# Patient Record
Sex: Male | Born: 1935 | Race: White | Hispanic: No | Marital: Married | Smoking: Former smoker
Health system: Southern US, Community
[De-identification: ages and names within clinical notes are randomized; demographics above are authoritative.]

## PROBLEM LIST (undated history)

## (undated) DIAGNOSIS — E039 Hypothyroidism, unspecified: Secondary | ICD-10-CM

## (undated) DIAGNOSIS — I2510 Atherosclerotic heart disease of native coronary artery without angina pectoris: Secondary | ICD-10-CM

## (undated) DIAGNOSIS — Z7901 Long term (current) use of anticoagulants: Secondary | ICD-10-CM

## (undated) DIAGNOSIS — I4891 Unspecified atrial fibrillation: Secondary | ICD-10-CM

## (undated) DIAGNOSIS — C8190 Hodgkin lymphoma, unspecified, unspecified site: Secondary | ICD-10-CM

## (undated) DIAGNOSIS — R109 Unspecified abdominal pain: Secondary | ICD-10-CM

## (undated) DIAGNOSIS — N429 Disorder of prostate, unspecified: Secondary | ICD-10-CM

## (undated) DIAGNOSIS — J449 Chronic obstructive pulmonary disease, unspecified: Secondary | ICD-10-CM

## (undated) DIAGNOSIS — I219 Acute myocardial infarction, unspecified: Secondary | ICD-10-CM

## (undated) DIAGNOSIS — K8020 Calculus of gallbladder without cholecystitis without obstruction: Secondary | ICD-10-CM

## (undated) DIAGNOSIS — I499 Cardiac arrhythmia, unspecified: Secondary | ICD-10-CM

## (undated) DIAGNOSIS — R413 Other amnesia: Secondary | ICD-10-CM

## (undated) DIAGNOSIS — H269 Unspecified cataract: Secondary | ICD-10-CM

## (undated) DIAGNOSIS — G250 Essential tremor: Secondary | ICD-10-CM

## (undated) DIAGNOSIS — N189 Chronic kidney disease, unspecified: Secondary | ICD-10-CM

## (undated) DIAGNOSIS — E785 Hyperlipidemia, unspecified: Secondary | ICD-10-CM

## (undated) DIAGNOSIS — K469 Unspecified abdominal hernia without obstruction or gangrene: Secondary | ICD-10-CM

## (undated) HISTORY — DX: Atherosclerotic heart disease of native coronary artery without angina pectoris: I25.10

## (undated) HISTORY — DX: Hodgkin lymphoma, unspecified, unspecified site: C81.90

## (undated) HISTORY — DX: Disorder of prostate, unspecified: N42.9

## (undated) HISTORY — PX: HEMORRHOID SURGERY: SHX153

## (undated) HISTORY — PX: CATARACT EXTRACTION, BILATERAL: SHX1313

## (undated) HISTORY — DX: Other amnesia: R41.3

## (undated) HISTORY — DX: Hyperlipidemia, unspecified: E78.5

## (undated) HISTORY — DX: Acute myocardial infarction, unspecified: I21.9

## (undated) HISTORY — PX: PTCA: SHX146

## (undated) HISTORY — PX: TRANSURETHRAL RESECTION OF PROSTATE: SHX73

## (undated) HISTORY — DX: Calculus of gallbladder without cholecystitis without obstruction: K80.20

## (undated) HISTORY — DX: Long term (current) use of anticoagulants: Z79.01

## (undated) HISTORY — DX: Unspecified abdominal pain: R10.9

## (undated) HISTORY — DX: Unspecified cataract: H26.9

## (undated) HISTORY — DX: Essential tremor: G25.0

## (undated) HISTORY — DX: Chronic obstructive pulmonary disease, unspecified: J44.9

## (undated) HISTORY — DX: Chronic kidney disease, unspecified: N18.9

## (undated) HISTORY — DX: Hypothyroidism, unspecified: E03.9

---

## 2001-06-04 DIAGNOSIS — C8190 Hodgkin lymphoma, unspecified, unspecified site: Secondary | ICD-10-CM

## 2001-06-04 HISTORY — DX: Hodgkin lymphoma, unspecified, unspecified site: C81.90

## 2003-06-05 HISTORY — PX: CHOLECYSTECTOMY: SHX55

## 2003-06-22 ENCOUNTER — Emergency Department (HOSPITAL_COMMUNITY): Admission: EM | Admit: 2003-06-22 | Discharge: 2003-06-23 | Payer: Self-pay | Admitting: Emergency Medicine

## 2009-02-19 ENCOUNTER — Emergency Department (HOSPITAL_BASED_OUTPATIENT_CLINIC_OR_DEPARTMENT_OTHER): Admission: EM | Admit: 2009-02-19 | Discharge: 2009-02-20 | Payer: Self-pay | Admitting: Emergency Medicine

## 2009-02-20 ENCOUNTER — Ambulatory Visit: Payer: Self-pay | Admitting: Diagnostic Radiology

## 2009-02-23 ENCOUNTER — Ambulatory Visit: Payer: Self-pay | Admitting: Diagnostic Radiology

## 2009-02-23 ENCOUNTER — Emergency Department (HOSPITAL_BASED_OUTPATIENT_CLINIC_OR_DEPARTMENT_OTHER): Admission: EM | Admit: 2009-02-23 | Discharge: 2009-02-23 | Payer: Self-pay | Admitting: Emergency Medicine

## 2009-04-06 ENCOUNTER — Ambulatory Visit: Payer: Self-pay | Admitting: Radiology

## 2009-04-06 ENCOUNTER — Emergency Department (HOSPITAL_BASED_OUTPATIENT_CLINIC_OR_DEPARTMENT_OTHER): Admission: EM | Admit: 2009-04-06 | Discharge: 2009-04-06 | Payer: Self-pay | Admitting: Emergency Medicine

## 2009-06-24 ENCOUNTER — Ambulatory Visit: Payer: Self-pay | Admitting: Diagnostic Radiology

## 2009-06-24 ENCOUNTER — Emergency Department (HOSPITAL_BASED_OUTPATIENT_CLINIC_OR_DEPARTMENT_OTHER)
Admission: EM | Admit: 2009-06-24 | Discharge: 2009-06-24 | Payer: Self-pay | Source: Home / Self Care | Admitting: Emergency Medicine

## 2009-08-23 ENCOUNTER — Ambulatory Visit: Payer: Self-pay | Admitting: Diagnostic Radiology

## 2009-08-23 ENCOUNTER — Emergency Department (HOSPITAL_BASED_OUTPATIENT_CLINIC_OR_DEPARTMENT_OTHER)
Admission: EM | Admit: 2009-08-23 | Discharge: 2009-08-23 | Payer: Self-pay | Source: Home / Self Care | Admitting: Emergency Medicine

## 2009-09-01 ENCOUNTER — Emergency Department (HOSPITAL_BASED_OUTPATIENT_CLINIC_OR_DEPARTMENT_OTHER): Admission: EM | Admit: 2009-09-01 | Discharge: 2009-09-01 | Payer: Self-pay | Admitting: Emergency Medicine

## 2009-09-01 ENCOUNTER — Ambulatory Visit: Payer: Self-pay | Admitting: Diagnostic Radiology

## 2010-06-18 ENCOUNTER — Emergency Department (HOSPITAL_BASED_OUTPATIENT_CLINIC_OR_DEPARTMENT_OTHER)
Admission: EM | Admit: 2010-06-18 | Discharge: 2010-06-18 | Payer: Self-pay | Source: Home / Self Care | Admitting: Emergency Medicine

## 2010-06-19 LAB — URINALYSIS, ROUTINE W REFLEX MICROSCOPIC
Bilirubin Urine: NEGATIVE
Ketones, ur: NEGATIVE mg/dL
Nitrite: NEGATIVE
Protein, ur: NEGATIVE mg/dL
Specific Gravity, Urine: 1.024 (ref 1.005–1.030)
Urine Glucose, Fasting: NEGATIVE mg/dL
Urobilinogen, UA: 1 mg/dL (ref 0.0–1.0)
pH: 5.5 (ref 5.0–8.0)

## 2010-06-19 LAB — DIFFERENTIAL
Basophils Absolute: 0 10*3/uL (ref 0.0–0.1)
Basophils Relative: 0 % (ref 0–1)
Eosinophils Absolute: 0.3 10*3/uL (ref 0.0–0.7)
Eosinophils Relative: 1 % (ref 0–5)
Lymphocytes Relative: 3 % — ABNORMAL LOW (ref 12–46)
Lymphs Abs: 0.8 10*3/uL (ref 0.7–4.0)
Monocytes Absolute: 2.2 10*3/uL — ABNORMAL HIGH (ref 0.1–1.0)
Monocytes Relative: 8 % (ref 3–12)
Neutro Abs: 24.3 10*3/uL — ABNORMAL HIGH (ref 1.7–7.7)
Neutrophils Relative %: 88 % — ABNORMAL HIGH (ref 43–77)

## 2010-06-19 LAB — BASIC METABOLIC PANEL
BUN: 28 mg/dL — ABNORMAL HIGH (ref 6–23)
CO2: 23 mEq/L (ref 19–32)
Calcium: 8.6 mg/dL (ref 8.4–10.5)
Chloride: 107 mEq/L (ref 96–112)
Creatinine, Ser: 1.3 mg/dL (ref 0.4–1.5)
GFR calc Af Amer: >60 mL/min (ref >60)
GFR calc non Af Amer: 54 mL/min — ABNORMAL LOW (ref >60)
Glucose, Bld: 131 mg/dL — ABNORMAL HIGH (ref 70–99)
Potassium: 4.6 mEq/L (ref 3.5–5.1)
Sodium: 142 mEq/L (ref 135–145)

## 2010-06-19 LAB — CBC
HCT: 45.7 % (ref 39.0–52.0)
Hemoglobin: 15.9 g/dL (ref 13.0–17.0)
MCH: 32.2 pg (ref 26.0–34.0)
MCHC: 34.8 g/dL (ref 30.0–36.0)
MCV: 92.5 fL (ref 78.0–100.0)
Platelets: 219 10*3/uL (ref 150–400)
RBC: 4.94 MIL/uL (ref 4.22–5.81)
RDW: 15.8 % — ABNORMAL HIGH (ref 11.5–15.5)
WBC: 27.6 10*3/uL — ABNORMAL HIGH (ref 4.0–10.5)

## 2010-06-19 LAB — PROTIME-INR
INR: 1.96 — ABNORMAL HIGH (ref 0.00–1.49)
Prothrombin Time: 22.5 seconds — ABNORMAL HIGH (ref 11.6–15.2)

## 2010-06-19 LAB — URINE MICROSCOPIC-ADD ON

## 2010-06-21 LAB — URINE CULTURE
Colony Count: >=100000
Culture  Setup Time: 201201161558

## 2010-08-28 LAB — CBC
HCT: 45.5 % (ref 39.0–52.0)
HCT: 51.8 % (ref 39.0–52.0)
Hemoglobin: 15.7 g/dL (ref 13.0–17.0)
Hemoglobin: 17.5 g/dL — ABNORMAL HIGH (ref 13.0–17.0)
MCHC: 33.8 g/dL (ref 30.0–36.0)
MCHC: 34.5 g/dL (ref 30.0–36.0)
MCV: 87.1 fL (ref 78.0–100.0)
MCV: 88.1 fL (ref 78.0–100.0)
Platelets: 199 10*3/uL (ref 150–400)
Platelets: 208 10*3/uL (ref 150–400)
RBC: 5.22 MIL/uL (ref 4.22–5.81)
RBC: 5.88 MIL/uL — ABNORMAL HIGH (ref 4.22–5.81)
RDW: 19.5 % — ABNORMAL HIGH (ref 11.5–15.5)
RDW: 20.9 % — ABNORMAL HIGH (ref 11.5–15.5)
WBC: 10.5 10*3/uL (ref 4.0–10.5)
WBC: 12.1 10*3/uL — ABNORMAL HIGH (ref 4.0–10.5)

## 2010-08-28 LAB — URINALYSIS, ROUTINE W REFLEX MICROSCOPIC
Bilirubin Urine: NEGATIVE
Glucose, UA: NEGATIVE mg/dL
Hgb urine dipstick: NEGATIVE
Ketones, ur: NEGATIVE mg/dL
Nitrite: NEGATIVE
Protein, ur: NEGATIVE mg/dL
Specific Gravity, Urine: 1.025 (ref 1.005–1.030)
Urobilinogen, UA: 0.2 mg/dL (ref 0.0–1.0)
pH: 5.5 (ref 5.0–8.0)

## 2010-08-28 LAB — BASIC METABOLIC PANEL WITH GFR
BUN: 29 mg/dL — ABNORMAL HIGH (ref 6–23)
CO2: 28 meq/L (ref 19–32)
Calcium: 8.3 mg/dL — ABNORMAL LOW (ref 8.4–10.5)
Chloride: 105 meq/L (ref 96–112)
Creatinine, Ser: 1.3 mg/dL (ref 0.4–1.5)
GFR calc non Af Amer: 54 mL/min — ABNORMAL LOW
Glucose, Bld: 103 mg/dL — ABNORMAL HIGH (ref 70–99)
Potassium: 4.1 meq/L (ref 3.5–5.1)
Sodium: 144 meq/L (ref 135–145)

## 2010-08-28 LAB — COMPREHENSIVE METABOLIC PANEL
ALT: 32 U/L (ref 0–53)
AST: 48 U/L — ABNORMAL HIGH (ref 0–37)
Albumin: 4.5 g/dL (ref 3.5–5.2)
CO2: 30 mEq/L (ref 19–32)
Chloride: 100 mEq/L (ref 96–112)
GFR calc Af Amer: 51 mL/min — ABNORMAL LOW (ref >60)
GFR calc non Af Amer: 42 mL/min — ABNORMAL LOW (ref >60)
Sodium: 143 mEq/L (ref 135–145)
Total Bilirubin: 1.2 mg/dL (ref 0.3–1.2)

## 2010-08-28 LAB — POCT CARDIAC MARKERS
CKMB, poc: 1.2 ng/mL (ref 1.0–8.0)
CKMB, poc: 1.7 ng/mL (ref 1.0–8.0)
CKMB, poc: <1 ng/mL — ABNORMAL LOW (ref 1.0–8.0)
Myoglobin, poc: 39.7 ng/mL (ref 12–200)
Myoglobin, poc: 50.3 ng/mL (ref 12–200)
Troponin i, poc: <0.05 ng/mL (ref 0.00–0.09)
Troponin i, poc: <0.05 ng/mL (ref 0.00–0.09)
Troponin i, poc: <0.05 ng/mL (ref 0.00–0.09)

## 2010-08-28 LAB — DIFFERENTIAL
Basophils Absolute: 0.1 10*3/uL (ref 0.0–0.1)
Basophils Absolute: 0.3 K/uL — ABNORMAL HIGH (ref 0.0–0.1)
Basophils Relative: 2 % — ABNORMAL HIGH (ref 0–1)
Eosinophils Absolute: 0.2 10*3/uL (ref 0.0–0.7)
Eosinophils Absolute: 0.3 K/uL (ref 0.0–0.7)
Eosinophils Relative: 2 % (ref 0–5)
Eosinophils Relative: 3 % (ref 0–5)
Lymphocytes Relative: 13 % (ref 12–46)
Lymphs Abs: 1.3 10*3/uL (ref 0.7–4.0)
Lymphs Abs: 1.4 K/uL (ref 0.7–4.0)
Monocytes Absolute: 1 10*3/uL (ref 0.1–1.0)
Monocytes Absolute: 1.2 K/uL — ABNORMAL HIGH (ref 0.1–1.0)
Monocytes Relative: 11 % (ref 3–12)
Neutro Abs: 7.3 K/uL (ref 1.7–7.7)
Neutrophils Relative %: 70 % (ref 43–77)

## 2010-08-28 LAB — APTT
aPTT: 33 seconds (ref 24–37)
aPTT: 38 seconds — ABNORMAL HIGH (ref 24–37)

## 2010-08-28 LAB — PROTIME-INR
INR: 2.07 — ABNORMAL HIGH (ref 0.00–1.49)
INR: 3.28 — ABNORMAL HIGH (ref 0.00–1.49)
Prothrombin Time: 33.1 seconds — ABNORMAL HIGH (ref 11.6–15.2)

## 2010-08-28 LAB — LIPASE, BLOOD: Lipase: 91 U/L (ref 23–300)

## 2010-08-28 LAB — POCT B-TYPE NATRIURETIC PEPTIDE (BNP): B Natriuretic Peptide, POC: 75.8 pg/mL (ref 0–100)

## 2010-09-06 LAB — DIFFERENTIAL
Basophils Absolute: 0 10*3/uL (ref 0.0–0.1)
Eosinophils Relative: 2 % (ref 0–5)
Lymphocytes Relative: 8 % — ABNORMAL LOW (ref 12–46)
Lymphs Abs: 1.7 10*3/uL (ref 0.7–4.0)
Monocytes Relative: 7 % (ref 3–12)
Neutrophils Relative %: 83 % — ABNORMAL HIGH (ref 43–77)

## 2010-09-06 LAB — BASIC METABOLIC PANEL
BUN: 42 mg/dL — ABNORMAL HIGH (ref 6–23)
Calcium: 8.5 mg/dL (ref 8.4–10.5)
Creatinine, Ser: 1.9 mg/dL — ABNORMAL HIGH (ref 0.4–1.5)
GFR calc non Af Amer: 35 mL/min — ABNORMAL LOW (ref >60)
Glucose, Bld: 92 mg/dL (ref 70–99)
Potassium: 3.9 mEq/L (ref 3.5–5.1)

## 2010-09-06 LAB — PROTIME-INR
INR: 9.88 (ref 0.00–1.49)
Prothrombin Time: 78.4 seconds — ABNORMAL HIGH (ref 11.6–15.2)

## 2010-09-06 LAB — URINALYSIS, ROUTINE W REFLEX MICROSCOPIC
Glucose, UA: NEGATIVE mg/dL
Ketones, ur: NEGATIVE mg/dL
Protein, ur: NEGATIVE mg/dL
Urobilinogen, UA: 1 mg/dL (ref 0.0–1.0)

## 2010-09-06 LAB — APTT: aPTT: 68 seconds — ABNORMAL HIGH (ref 24–37)

## 2010-09-06 LAB — CBC
HCT: 38.7 % — ABNORMAL LOW (ref 39.0–52.0)
Platelets: 302 10*3/uL (ref 150–400)
RDW: 16.7 % — ABNORMAL HIGH (ref 11.5–15.5)
WBC: 21.6 10*3/uL — ABNORMAL HIGH (ref 4.0–10.5)

## 2010-09-06 LAB — POCT CARDIAC MARKERS: Troponin i, poc: <0.05 ng/mL (ref 0.00–0.09)

## 2010-09-06 LAB — HEMOCCULT GUIAC POC 1CARD (OFFICE): Fecal Occult Bld: POSITIVE

## 2010-09-08 LAB — DIFFERENTIAL
Basophils Absolute: 0.1 10*3/uL (ref 0.0–0.1)
Basophils Relative: 1 % (ref 0–1)
Basophils Relative: 2 % — ABNORMAL HIGH (ref 0–1)
Eosinophils Absolute: 0.3 10*3/uL (ref 0.0–0.7)
Eosinophils Relative: 2 % (ref 0–5)
Eosinophils Relative: 3 % (ref 0–5)
Lymphs Abs: 1.6 10*3/uL (ref 0.7–4.0)
Monocytes Absolute: 1.1 10*3/uL — ABNORMAL HIGH (ref 0.1–1.0)
Monocytes Absolute: 1.1 10*3/uL — ABNORMAL HIGH (ref 0.1–1.0)
Neutro Abs: 7 10*3/uL (ref 1.7–7.7)

## 2010-09-08 LAB — POCT CARDIAC MARKERS
Myoglobin, poc: 66.2 ng/mL (ref 12–200)
Troponin i, poc: <0.05 ng/mL (ref 0.00–0.09)
Troponin i, poc: <0.05 ng/mL (ref 0.00–0.09)

## 2010-09-08 LAB — COMPREHENSIVE METABOLIC PANEL
Albumin: 3.9 g/dL (ref 3.5–5.2)
Alkaline Phosphatase: 98 U/L (ref 39–117)
BUN: 28 mg/dL — ABNORMAL HIGH (ref 6–23)
CO2: 32 mEq/L (ref 19–32)
Chloride: 101 mEq/L (ref 96–112)
Potassium: 4.3 mEq/L (ref 3.5–5.1)
Total Bilirubin: 0.9 mg/dL (ref 0.3–1.2)

## 2010-09-08 LAB — CBC
HCT: 39.5 % (ref 39.0–52.0)
HCT: 42.5 % (ref 39.0–52.0)
Hemoglobin: 14.2 g/dL (ref 13.0–17.0)
MCHC: 34.9 g/dL (ref 30.0–36.0)
MCV: 82.2 fL (ref 78.0–100.0)
Platelets: 203 10*3/uL (ref 150–400)
Platelets: 239 10*3/uL (ref 150–400)
RBC: 4.81 MIL/uL (ref 4.22–5.81)
RBC: 5.11 MIL/uL (ref 4.22–5.81)
WBC: 10.8 10*3/uL — ABNORMAL HIGH (ref 4.0–10.5)
WBC: 9.6 10*3/uL (ref 4.0–10.5)

## 2010-09-08 LAB — POCT I-STAT 3, ART BLOOD GAS (G3+)
Patient temperature: 97.9
pCO2 arterial: 37.8 mmHg (ref 35.0–45.0)
pH, Arterial: 7.425 (ref 7.350–7.450)

## 2010-09-08 LAB — POCT B-TYPE NATRIURETIC PEPTIDE (BNP): B Natriuretic Peptide, POC: 192 pg/mL — ABNORMAL HIGH (ref 0–100)

## 2010-09-08 LAB — BASIC METABOLIC PANEL
BUN: 29 mg/dL — ABNORMAL HIGH (ref 6–23)
CO2: 26 mEq/L (ref 19–32)
Chloride: 103 mEq/L (ref 96–112)
Potassium: 4.4 mEq/L (ref 3.5–5.1)

## 2010-09-08 LAB — PROTIME-INR: INR: 3.3 — ABNORMAL HIGH (ref 0.00–1.49)

## 2013-01-20 DIAGNOSIS — E039 Hypothyroidism, unspecified: Secondary | ICD-10-CM | POA: Insufficient documentation

## 2013-01-20 DIAGNOSIS — E785 Hyperlipidemia, unspecified: Secondary | ICD-10-CM | POA: Insufficient documentation

## 2013-01-29 ENCOUNTER — Encounter: Payer: Self-pay | Admitting: Cardiology

## 2013-01-29 ENCOUNTER — Institutional Professional Consult (permissible substitution): Payer: Self-pay | Admitting: Internal Medicine

## 2013-02-10 ENCOUNTER — Encounter: Payer: Self-pay | Admitting: Internal Medicine

## 2013-03-25 DIAGNOSIS — N179 Acute kidney failure, unspecified: Secondary | ICD-10-CM | POA: Diagnosis present

## 2013-04-01 DIAGNOSIS — G250 Essential tremor: Secondary | ICD-10-CM | POA: Insufficient documentation

## 2013-04-01 DIAGNOSIS — Z8571 Personal history of Hodgkin lymphoma: Secondary | ICD-10-CM | POA: Insufficient documentation

## 2013-04-22 DIAGNOSIS — R109 Unspecified abdominal pain: Secondary | ICD-10-CM

## 2013-04-22 HISTORY — DX: Unspecified abdominal pain: R10.9

## 2013-06-02 ENCOUNTER — Encounter (HOSPITAL_COMMUNITY): Payer: Self-pay | Admitting: Emergency Medicine

## 2013-06-02 ENCOUNTER — Emergency Department (HOSPITAL_COMMUNITY)
Admission: EM | Admit: 2013-06-02 | Discharge: 2013-06-02 | Disposition: A | Discharge status: Home / Self Care | Payer: Medicare Other | Attending: Emergency Medicine | Admitting: Emergency Medicine

## 2013-06-02 ENCOUNTER — Emergency Department (HOSPITAL_COMMUNITY): Payer: Medicare Other

## 2013-06-02 DIAGNOSIS — Z8719 Personal history of other diseases of the digestive system: Secondary | ICD-10-CM | POA: Insufficient documentation

## 2013-06-02 DIAGNOSIS — I252 Old myocardial infarction: Secondary | ICD-10-CM | POA: Insufficient documentation

## 2013-06-02 DIAGNOSIS — E079 Disorder of thyroid, unspecified: Secondary | ICD-10-CM | POA: Insufficient documentation

## 2013-06-02 DIAGNOSIS — I4891 Unspecified atrial fibrillation: Secondary | ICD-10-CM | POA: Insufficient documentation

## 2013-06-02 DIAGNOSIS — W010XXA Fall on same level from slipping, tripping and stumbling without subsequent striking against object, initial encounter: Secondary | ICD-10-CM | POA: Insufficient documentation

## 2013-06-02 DIAGNOSIS — Y9289 Other specified places as the place of occurrence of the external cause: Secondary | ICD-10-CM | POA: Insufficient documentation

## 2013-06-02 DIAGNOSIS — Z79899 Other long term (current) drug therapy: Secondary | ICD-10-CM | POA: Insufficient documentation

## 2013-06-02 DIAGNOSIS — Z87448 Personal history of other diseases of urinary system: Secondary | ICD-10-CM | POA: Insufficient documentation

## 2013-06-02 DIAGNOSIS — Z23 Encounter for immunization: Secondary | ICD-10-CM | POA: Insufficient documentation

## 2013-06-02 DIAGNOSIS — S0990XA Unspecified injury of head, initial encounter: Secondary | ICD-10-CM

## 2013-06-02 DIAGNOSIS — W1809XA Striking against other object with subsequent fall, initial encounter: Secondary | ICD-10-CM | POA: Insufficient documentation

## 2013-06-02 DIAGNOSIS — Z7982 Long term (current) use of aspirin: Secondary | ICD-10-CM | POA: Insufficient documentation

## 2013-06-02 DIAGNOSIS — Z7901 Long term (current) use of anticoagulants: Secondary | ICD-10-CM | POA: Insufficient documentation

## 2013-06-02 DIAGNOSIS — Y9301 Activity, walking, marching and hiking: Secondary | ICD-10-CM | POA: Insufficient documentation

## 2013-06-02 DIAGNOSIS — Z8571 Personal history of Hodgkin lymphoma: Secondary | ICD-10-CM | POA: Insufficient documentation

## 2013-06-02 DIAGNOSIS — S060X0A Concussion without loss of consciousness, initial encounter: Secondary | ICD-10-CM | POA: Insufficient documentation

## 2013-06-02 DIAGNOSIS — Z8669 Personal history of other diseases of the nervous system and sense organs: Secondary | ICD-10-CM | POA: Insufficient documentation

## 2013-06-02 HISTORY — DX: Unspecified abdominal hernia without obstruction or gangrene: K46.9

## 2013-06-02 HISTORY — DX: Unspecified atrial fibrillation: I48.91

## 2013-06-02 LAB — CBC WITH DIFFERENTIAL/PLATELET
Basophils Absolute: 0 10*3/uL (ref 0.0–0.1)
HCT: 43.3 % (ref 39.0–52.0)
Hemoglobin: 14.4 g/dL (ref 13.0–17.0)
Lymphocytes Relative: 8 % — ABNORMAL LOW (ref 12–46)
Lymphs Abs: 0.8 10*3/uL (ref 0.7–4.0)
Monocytes Absolute: 1 10*3/uL (ref 0.1–1.0)
Monocytes Relative: 9 % (ref 3–12)
Neutro Abs: 8.3 10*3/uL — ABNORMAL HIGH (ref 1.7–7.7)
RBC: 4.58 MIL/uL (ref 4.22–5.81)
WBC: 10.4 10*3/uL (ref 4.0–10.5)

## 2013-06-02 MED ORDER — TETANUS-DIPHTH-ACELL PERTUSSIS 5-2.5-18.5 LF-MCG/0.5 IM SUSP
0.5000 mL | Freq: Once | INTRAMUSCULAR | Status: AC
  Administered 2013-06-02: 0.5 mL via INTRAMUSCULAR
  Filled 2013-06-02: qty 0.5

## 2013-06-02 MED ORDER — BACITRACIN ZINC 500 UNIT/GM EX OINT
1.0000 "application " | TOPICAL_OINTMENT | Freq: Once | CUTANEOUS | Status: AC
  Administered 2013-06-02: 1 via TOPICAL
  Filled 2013-06-02: qty 0.9

## 2013-06-02 NOTE — ED Notes (Signed)
Bed: WA21 Expected date:  Expected time:  Means of arrival:  Comments: Wesley Wang

## 2013-06-02 NOTE — ED Notes (Signed)
Per EMS. Pt had fall when stepping off curb and got tangled in cane. Pt on coumadin and aspirin. Pt had abrasion above R eye, bleeding stopped upon EMS arrival. Denies LOC or pain.

## 2013-06-02 NOTE — ED Provider Notes (Signed)
CSN: [id number]     Arrival date & time 06/02/13  1631 History   First MD Initiated Contact with Patient 06/02/13 1636     Chief Complaint  Patient presents with  . Fall    HPI The patient presents to the emergency room after a stumble and fall. The patient was walking with his cane when he tripped off a curb. Patient states he fell to the ground striking his right forehead above the right eye. Patient sustained a small abrasion. He did not lose consciousness. He's not having any neck pain chest pain abdominal pain or pain in his extremities. The patient does take Coumadin and so he came to the emergency room to be evaluated. Past Medical History  Diagnosis Date  . Heart attack   . Prostate disorder   . Gallstones   . Cancer 2003    [place]  . Cataracts, bilateral   . Hernia   . Thyroid disease   . Atrial fibrillation    Past Surgical History  Procedure Laterality Date  . Gallbladder surgery  2005  . Other surgical history  04/1994, 12/2001    Heart Surgery  . Cataract extraction, bilateral Bilateral 11/2010, 12/2010   Family History  Problem Relation Age of Onset  . Heart attack Mother   . Heart attack Brother   . Cancer Brother    History  Substance Use Topics  . Smoking status: Never Smoker   . Smokeless tobacco: Not on file  . Alcohol Use: No    Review of Systems  All other systems reviewed and are negative.    Allergies  Sulfa antibiotics  Home Medications   Current Outpatient Rx  Name  Route  Sig  Dispense  Refill  . amiodarone (PACERONE) 200 MG tablet   Oral   Take 200 mg by mouth every evening.          Marland Kitchen aspirin 81 MG chewable tablet   Oral   Chew 81 mg by mouth daily.         . carvedilol (COREG) 12.5 MG tablet   Oral   Take 12.5 mg by mouth 2 (two) times daily with a meal.          . citalopram (CELEXA) 20 MG tablet   Oral   Take 20 mg by mouth every evening.          . isosorbide mononitrate (IMDUR) 60 MG 24 hr tablet   Oral  Take 60 mg by mouth every evening.          Marland Kitchen KLOR-CON M20 20 MEQ tablet   Oral   Take 20 mEq by mouth 2 (two) times daily.          Marland Kitchen levothyroxine (SYNTHROID, LEVOTHROID) 50 MCG tablet   Oral   Take 50 mcg by mouth daily before breakfast.          . NITROSTAT 0.4 MG SL tablet   Sublingual   Place 0.4 mg under the tongue every 5 (five) minutes as needed for chest pain.          . simvastatin (ZOCOR) 40 MG tablet   Oral   Take 40 mg by mouth every evening.          . warfarin (COUMADIN) 2.5 MG tablet   Oral   Take 2.5-5 mg by mouth every evening. 5mg  on Su 2.5 mg on  M,T,W,Th,F,Sa          BP 102/61  Pulse 75  Temp(Src)  98.1 F (36.7 C) (Oral)  Resp 16  SpO2 92% Physical Exam  Nursing note and vitals reviewed. Constitutional: He appears well-developed and well-nourished. No distress.  HENT:  Head: Normocephalic.  Right Ear: External ear normal.  Left Ear: External ear normal.  Small abrasion and hematoma above the right eyebrow, no active bleeding  Eyes: Conjunctivae are normal. Right eye exhibits no discharge. Left eye exhibits no discharge. No scleral icterus.  Neck: Neck supple. No tracheal deviation present.  No cervical spine tenderness  Cardiovascular: Normal rate, regular rhythm and intact distal pulses.   Pulmonary/Chest: Effort normal and breath sounds normal. No stridor. No respiratory distress. He has no wheezes. He has no rales.  Abdominal: Soft. Bowel sounds are normal. He exhibits no distension. There is no tenderness. There is no rebound and no guarding.  Musculoskeletal: He exhibits no edema and no tenderness.  No lumbar or thoracic spine tenderness, full range of motion without pain in all extremities  Neurological: He is alert. He has normal strength. No sensory deficit. Cranial nerve deficit:  no gross defecits noted. He exhibits normal muscle tone. He displays no seizure activity. Coordination normal.  Skin: Skin is warm and dry. No rash  noted.  Psychiatric: He has a normal mood and affect.    ED Course  Procedures (including critical care time) Labs Review Labs Reviewed  CBC WITH DIFFERENTIAL - Abnormal; Notable for the following:    Neutrophils Relative % 80 (*)    Neutro Abs 8.3 (*)    Lymphocytes Relative 8 (*)    All other components within normal limits  PROTIME-INR - Abnormal; Notable for the following:    Prothrombin Time 19.8 (*)    INR 1.74 (*)    All other components within normal limits   Imaging Review Ct Head Wo Contrast  06/02/2013   CLINICAL DATA:  Fall.  EXAM: CT HEAD WITHOUT CONTRAST  TECHNIQUE: Contiguous axial images were obtained from the base of the skull through the vertex without intravenous contrast.  COMPARISON:  04/06/2009.  FINDINGS: No mass. No hydrocephalus. No hemorrhage. Diffuse atrophy present. Soft tissue swelling of the right frontal region. No underlying fracture. Mild mucosal thickening noted of the ethmoidal and maxillary sinuses. Mastoids are clear.  IMPRESSION: 1.  Right frontal soft tissue swelling.  No evidence of fracture.  2. No acute intracranial abnormality. Diffuse atrophy. Stable from 04/06/2009.   Electronically Signed   By: Maisie Fus  Register   On: 06/02/2013 17:36    EKG Interpretation   None       MDM   1. Minor head injury without loss of consciousness, initial encounter    Patient's CT scan does not show evidence of any intracranial bleeding. No evidence of fracture. Patient is on Coumadin although fortunately  INR is only 1.74.  Warning signs and precautions discussed.    Celene Kras, MD 06/02/13 [phone]

## 2013-06-11 ENCOUNTER — Encounter: Payer: Self-pay | Admitting: Internal Medicine

## 2013-06-11 ENCOUNTER — Ambulatory Visit (INDEPENDENT_AMBULATORY_CARE_PROVIDER_SITE_OTHER): Payer: Medicare Other | Admitting: Internal Medicine

## 2013-06-11 VITALS — BP 110/70 | HR 72 | Ht 67.0 in | Wt 172.4 lb

## 2013-06-11 DIAGNOSIS — I4891 Unspecified atrial fibrillation: Secondary | ICD-10-CM

## 2013-06-11 DIAGNOSIS — I2510 Atherosclerotic heart disease of native coronary artery without angina pectoris: Secondary | ICD-10-CM

## 2013-06-11 DIAGNOSIS — I453 Trifascicular block: Secondary | ICD-10-CM

## 2013-06-11 DIAGNOSIS — Z7901 Long term (current) use of anticoagulants: Secondary | ICD-10-CM

## 2013-06-11 MED ORDER — RIVAROXABAN 15 MG PO TABS: 15.0000 mg | ORAL_TABLET | Freq: Two times a day (BID) | ORAL | Status: DC

## 2013-06-11 NOTE — Progress Notes (Signed)
Referring Physician:  Rollen Sox MD   Wesley Wang is a 80 y.o. male with a h/o longstanding atrial fibrillation, remote CAD s/p PTCA, and progressive difficulty with memory who presents today to establish cardiology care.  He recently moved to [place] and lives in an assisted living facility.  He appears to be doing reasonably well from a CV standpoint.  He reports that he has had afib previously but that this is controlled with amiodarone.  He is anticoagulated with coumadin though INRs have been very labile.  He remains reasonably active.  Today, he denies symptoms of palpitations, chest pain, shortness of breath, orthopnea, PND, lower extremity edema, dizziness, presyncope, syncope, or neurologic sequela. The patient is tolerating medications without difficulties and is otherwise without complaint today.   Past Medical History  Diagnosis Date  . Heart attack 04/1994 & 12/2001    s/p ptca but has not had stents or bypass  . Prostate disorder   . Gallstones     Cholecystectomy 2005  . Cataracts, bilateral   . Hernia   . Atrial fibrillation   . Long term (current) use of anticoagulants     on coumadin  . Memory disturbance     (05/20/13) son & pt state note problems w/keeping a schedule. Can't remember what day of the wk its is or Drs appts. Also can't remember if he has taken his meds sometimes. No problems functionally. Son states memory loss is progressive & rapid. Short term problems.  . Chronic kidney disease   . Coronary artery disease     PTCA x 2  . Hypothyroidism   . Hyperlipidemia   . COPD (chronic obstructive pulmonary disease)   . Essential tremor   . Hodgkin lymphoma 2003  . Abdominal pain 04/22/13    RUQ   Past Surgical History  Procedure Laterality Date  . Cholecystectomy  2005  . Cataract extraction, bilateral Bilateral 11/2010, 12/2010  . Ptca  04/1994 & 12/2001    x 2   . Hemorrhoid surgery    . Transurethral resection of prostate      Current  Outpatient Prescriptions  Medication Sig Dispense Refill  . amiodarone (PACERONE) 200 MG tablet Take 200 mg by mouth every evening.       Marland Kitchen aspirin 81 MG chewable tablet Chew 81 mg by mouth daily.      . carvedilol (COREG) 12.5 MG tablet Take 12.5 mg by mouth 2 (two) times daily with a meal.       . citalopram (CELEXA) 20 MG tablet Take 20 mg by mouth every evening.       . isosorbide mononitrate (IMDUR) 60 MG 24 hr tablet Take 60 mg by mouth every evening.       Marland Kitchen KLOR-CON M20 20 MEQ tablet Take 20 mEq by mouth 2 (two) times daily.       Marland Kitchen levothyroxine (SYNTHROID, LEVOTHROID) 50 MCG tablet Take 50 mcg by mouth daily before breakfast.       . NITROSTAT 0.4 MG SL tablet Place 0.4 mg under the tongue every 5 (five) minutes as needed for chest pain.       . simvastatin (ZOCOR) 40 MG tablet Take 40 mg by mouth every evening.       . warfarin (COUMADIN) 2.5 MG tablet Take 2.5-5 mg by mouth every evening. 5mg  on Su 2.5 mg on  M,T,W,Th,F,Sa       No current facility-administered medications for this visit.    Allergies  Allergen Reactions  .  Sulfa Antibiotics Hives    History   Social History  . Marital Status: Married    Spouse Name: N/A    Number of Children: N/A  . Years of Education: N/A   Occupational History  . Not on file.   Social History Main Topics  . Smoking status: Former Smoker    Quit date: 06/11/1990  . Smokeless tobacco: Not on file  . Alcohol Use: No     Comment: remote  . Drug Use: No  . Sexual Activity: Not Currently   Other Topics Concern  . Not on file   Social History Narrative   Pt live in [place] independent living.  Moved to [place] from [place] 7/14.  Widower.  Retired.  Previously worked as an Art gallery manager.  He worked on the [employer].  He contracted through [employer] for [employer].    Family History  Problem Relation Age of Onset  . Heart attack Mother   . Heart disease Mother   . Heart attack Brother   .  Cancer Brother   . Heart disease Brother     ROS- All systems are reviewed and negative except as per the HPI above  Physical Exam: Filed Vitals:   06/11/13 1540  BP: 110/70  Pulse: 72  Height: 5\' 7"  (1.702 m)  Weight: 172 lb 6.4 oz (78.2 kg)    GEN- The patient is elderly appearing, alert and oriented x 3 today.   Head- normocephalic, atraumatic Eyes-  Sclera clear, conjunctiva pink Ears- hearing intact Oropharynx- clear Neck- supple, no JVP Lymph- no cervical lymphadenopathy Lungs- Clear to ausculation bilaterally, normal work of breathing Heart- Regular rate and rhythm, no murmurs, rubs or gallops, PMI not laterally displaced GI- soft, NT, ND, + BS Extremities- no clubbing, cyanosis, or edema MS- no significant deformity or atrophy Skin- no rash or lesion Psych- euthymic mood, full affect Neuro- strength and sensation are intact  EKG- today reveals sinus rhythm 72 bpm, PR 258, RBBB, LPHB, nonspecific ST/T changes More than 15 pages are reviewed from primary care including labs and medicine lists.  Assessment and Plan:  1. afib Maintaining sinus rhythm with amiodarone He will need his primary care physician to follow LFTs and TFTs twice per year.  These have been recently obtained. Today, I discussed coumadin and novel anticoagulants including pradaxa, xarelto, and eliquis today as indicated for risk reduction in stroke and systemic emboli with nonvalvular atrial fibrillation.  Risks, benefits, and alternatives to each of these drugs were discussed at length today.  Given labile INRS, he would prefer to switch Xarelto.  As his creatinine clearance was recently 46, we will start xarelto 15mg  daily.  He will need his CrCl followed twice per year by primary care. We will obtain an echo to evaluate for structural changes related to afib.  2. CAD No ischemic symptoms As he is on [facility] and has not had ischemic symptoms for over a year, I have recommended that he stop aspirin at  this time.  3. Trifascicular block Asymptomatic We discussed his conduction system disease today.  As he has had no symptoms he does not wish to consider pacemaker at this time.  He would prefer a more conservative approach which I think is reasonable.  If he develops any symptoms of AV block then I would favor pacing at that time.    Return in 1 year for follow-up He is instructed to contact my office should any problems arise in the interim.

## 2013-06-11 NOTE — Patient Instructions (Addendum)
Your physician wants you to follow-up in: 12 months with Dr Vallery Ridge will receive a reminder letter in the mail two months in advance. If you don't receive a letter, please call our office to schedule the follow-up appointment.    Your physician has requested that you have an echocardiogram. Echocardiography is a painless test that uses sound waves to create images of your heart. It provides your doctor with information about the size and shape of your heart and how well your heart's chambers and valves are working. This procedure takes approximately one hour. There are no restrictions for this procedure.  Your physician recommends that you return for lab work INR today  Your physician has recommended you make the following change in your medication:  1) Stop Aspirin 2) Stop Warfarin 3) Start Xarelto 15mg  daily     .

## 2013-06-12 ENCOUNTER — Telehealth: Payer: Self-pay | Admitting: *Deleted

## 2013-06-12 LAB — PROTIME-INR
INR: 1.7 ratio — ABNORMAL HIGH (ref 0.8–1.0)
Prothrombin Time: 18.2 s — ABNORMAL HIGH (ref 10.2–12.4)

## 2013-06-12 NOTE — Telephone Encounter (Signed)
Pt called and advised he may start Xarelto today per Dr. Rayann Heman, INR 1.7

## 2013-06-14 DIAGNOSIS — I453 Trifascicular block: Secondary | ICD-10-CM | POA: Insufficient documentation

## 2013-06-14 DIAGNOSIS — I2510 Atherosclerotic heart disease of native coronary artery without angina pectoris: Secondary | ICD-10-CM | POA: Insufficient documentation

## 2013-06-14 DIAGNOSIS — I4891 Unspecified atrial fibrillation: Secondary | ICD-10-CM | POA: Insufficient documentation

## 2013-06-14 DIAGNOSIS — Z7901 Long term (current) use of anticoagulants: Secondary | ICD-10-CM | POA: Insufficient documentation

## 2013-06-17 DIAGNOSIS — R4189 Other symptoms and signs involving cognitive functions and awareness: Secondary | ICD-10-CM | POA: Insufficient documentation

## 2013-06-25 ENCOUNTER — Other Ambulatory Visit: Payer: Self-pay | Admitting: *Deleted

## 2013-06-25 MED ORDER — RIVAROXABAN 15 MG PO TABS: 15.0000 mg | ORAL_TABLET | Freq: Every day | ORAL | Status: DC

## 2013-06-26 ENCOUNTER — Ambulatory Visit (HOSPITAL_COMMUNITY): Payer: Medicare Other

## 2013-06-29 ENCOUNTER — Telehealth: Payer: Self-pay | Admitting: *Deleted

## 2013-06-29 NOTE — Telephone Encounter (Signed)
[employer] [place] approved for xarelto through 06/25/2014, member id # [id number]

## 2013-06-30 ENCOUNTER — Other Ambulatory Visit (HOSPITAL_COMMUNITY): Payer: Self-pay | Admitting: Internal Medicine

## 2013-06-30 DIAGNOSIS — I4891 Unspecified atrial fibrillation: Secondary | ICD-10-CM

## 2013-07-01 ENCOUNTER — Ambulatory Visit (HOSPITAL_COMMUNITY): Payer: Medicare Other | Attending: Cardiology | Admitting: Radiology

## 2013-07-01 DIAGNOSIS — I2510 Atherosclerotic heart disease of native coronary artery without angina pectoris: Secondary | ICD-10-CM | POA: Insufficient documentation

## 2013-07-01 DIAGNOSIS — J4489 Other specified chronic obstructive pulmonary disease: Secondary | ICD-10-CM | POA: Insufficient documentation

## 2013-07-01 DIAGNOSIS — I252 Old myocardial infarction: Secondary | ICD-10-CM | POA: Insufficient documentation

## 2013-07-01 DIAGNOSIS — J449 Chronic obstructive pulmonary disease, unspecified: Secondary | ICD-10-CM | POA: Insufficient documentation

## 2013-07-01 DIAGNOSIS — I453 Trifascicular block: Secondary | ICD-10-CM | POA: Insufficient documentation

## 2013-07-01 DIAGNOSIS — E785 Hyperlipidemia, unspecified: Secondary | ICD-10-CM | POA: Insufficient documentation

## 2013-07-01 DIAGNOSIS — I4891 Unspecified atrial fibrillation: Secondary | ICD-10-CM | POA: Insufficient documentation

## 2013-07-01 DIAGNOSIS — Z923 Personal history of irradiation: Secondary | ICD-10-CM | POA: Insufficient documentation

## 2013-07-01 DIAGNOSIS — Z9221 Personal history of antineoplastic chemotherapy: Secondary | ICD-10-CM | POA: Insufficient documentation

## 2013-07-01 DIAGNOSIS — C8589 Other specified types of non-Hodgkin lymphoma, extranodal and solid organ sites: Secondary | ICD-10-CM | POA: Insufficient documentation

## 2013-07-01 NOTE — Progress Notes (Signed)
Echocardiogram performed.  

## 2013-07-08 ENCOUNTER — Telehealth: Payer: Self-pay | Admitting: Internal Medicine

## 2013-07-08 NOTE — Telephone Encounter (Signed)
New message    Returning [facility] to get echo results on dad

## 2013-07-09 NOTE — Telephone Encounter (Signed)
Spoke with patient's son after leaving message and he will call if his Dad become's symptomatic

## 2013-07-31 ENCOUNTER — Other Ambulatory Visit: Payer: Self-pay

## 2013-07-31 MED ORDER — AMIODARONE HCL 200 MG PO TABS: 200.0000 mg | ORAL_TABLET | Freq: Every evening | ORAL | Status: AC

## 2013-10-08 ENCOUNTER — Inpatient Hospital Stay (HOSPITAL_COMMUNITY)
Admission: EM | Admit: 2013-10-08 | Discharge: 2013-10-12 | DRG: 377 | Disposition: A | Discharge status: Home Health | Payer: Medicare Other | Attending: Internal Medicine | Admitting: Internal Medicine

## 2013-10-08 ENCOUNTER — Emergency Department (HOSPITAL_COMMUNITY): Payer: Medicare Other

## 2013-10-08 ENCOUNTER — Encounter (HOSPITAL_COMMUNITY): Payer: Self-pay | Admitting: Emergency Medicine

## 2013-10-08 DIAGNOSIS — Z7901 Long term (current) use of anticoagulants: Secondary | ICD-10-CM

## 2013-10-08 DIAGNOSIS — K922 Gastrointestinal hemorrhage, unspecified: Secondary | ICD-10-CM | POA: Diagnosis present

## 2013-10-08 DIAGNOSIS — D126 Benign neoplasm of colon, unspecified: Secondary | ICD-10-CM | POA: Diagnosis present

## 2013-10-08 DIAGNOSIS — F0390 Unspecified dementia without behavioral disturbance: Secondary | ICD-10-CM | POA: Diagnosis present

## 2013-10-08 DIAGNOSIS — K5520 Angiodysplasia of colon without hemorrhage: Secondary | ICD-10-CM

## 2013-10-08 DIAGNOSIS — R578 Other shock: Secondary | ICD-10-CM | POA: Diagnosis present

## 2013-10-08 DIAGNOSIS — I2510 Atherosclerotic heart disease of native coronary artery without angina pectoris: Secondary | ICD-10-CM | POA: Diagnosis present

## 2013-10-08 DIAGNOSIS — N189 Chronic kidney disease, unspecified: Secondary | ICD-10-CM

## 2013-10-08 DIAGNOSIS — Z87891 Personal history of nicotine dependence: Secondary | ICD-10-CM

## 2013-10-08 DIAGNOSIS — R911 Solitary pulmonary nodule: Secondary | ICD-10-CM | POA: Diagnosis present

## 2013-10-08 DIAGNOSIS — J4489 Other specified chronic obstructive pulmonary disease: Secondary | ICD-10-CM | POA: Diagnosis present

## 2013-10-08 DIAGNOSIS — N179 Acute kidney failure, unspecified: Secondary | ICD-10-CM | POA: Diagnosis present

## 2013-10-08 DIAGNOSIS — Z8249 Family history of ischemic heart disease and other diseases of the circulatory system: Secondary | ICD-10-CM

## 2013-10-08 DIAGNOSIS — K449 Diaphragmatic hernia without obstruction or gangrene: Secondary | ICD-10-CM | POA: Diagnosis present

## 2013-10-08 DIAGNOSIS — J189 Pneumonia, unspecified organism: Secondary | ICD-10-CM | POA: Diagnosis present

## 2013-10-08 DIAGNOSIS — E785 Hyperlipidemia, unspecified: Secondary | ICD-10-CM | POA: Diagnosis present

## 2013-10-08 DIAGNOSIS — G252 Other specified forms of tremor: Secondary | ICD-10-CM

## 2013-10-08 DIAGNOSIS — Z9861 Coronary angioplasty status: Secondary | ICD-10-CM

## 2013-10-08 DIAGNOSIS — I4891 Unspecified atrial fibrillation: Secondary | ICD-10-CM

## 2013-10-08 DIAGNOSIS — K5521 Angiodysplasia of colon with hemorrhage: Principal | ICD-10-CM | POA: Diagnosis present

## 2013-10-08 DIAGNOSIS — D62 Acute posthemorrhagic anemia: Secondary | ICD-10-CM | POA: Diagnosis present

## 2013-10-08 DIAGNOSIS — R0902 Hypoxemia: Secondary | ICD-10-CM | POA: Diagnosis present

## 2013-10-08 DIAGNOSIS — Z7982 Long term (current) use of aspirin: Secondary | ICD-10-CM

## 2013-10-08 DIAGNOSIS — N183 Chronic kidney disease, stage 3 unspecified: Secondary | ICD-10-CM | POA: Diagnosis present

## 2013-10-08 DIAGNOSIS — Z8571 Personal history of Hodgkin lymphoma: Secondary | ICD-10-CM

## 2013-10-08 DIAGNOSIS — I959 Hypotension, unspecified: Secondary | ICD-10-CM

## 2013-10-08 DIAGNOSIS — G250 Essential tremor: Secondary | ICD-10-CM | POA: Diagnosis present

## 2013-10-08 DIAGNOSIS — Z882 Allergy status to sulfonamides status: Secondary | ICD-10-CM

## 2013-10-08 DIAGNOSIS — I252 Old myocardial infarction: Secondary | ICD-10-CM

## 2013-10-08 DIAGNOSIS — E039 Hypothyroidism, unspecified: Secondary | ICD-10-CM

## 2013-10-08 DIAGNOSIS — J449 Chronic obstructive pulmonary disease, unspecified: Secondary | ICD-10-CM | POA: Diagnosis present

## 2013-10-08 DIAGNOSIS — E8779 Other fluid overload: Secondary | ICD-10-CM | POA: Diagnosis not present

## 2013-10-08 LAB — I-STAT CHEM 8, ED
BUN: 20 mg/dL (ref 6–23)
CALCIUM ION: 1.13 mmol/L (ref 1.13–1.30)
CREATININE: 1.4 mg/dL — AB (ref 0.50–1.35)
Chloride: 107 mEq/L (ref 96–112)
Glucose, Bld: 161 mg/dL — ABNORMAL HIGH (ref 70–99)
HCT: 26 % — ABNORMAL LOW (ref 39.0–52.0)
HEMOGLOBIN: 8.8 g/dL — AB (ref 13.0–17.0)
Potassium: 4.4 mEq/L (ref 3.7–5.3)
SODIUM: 141 meq/L (ref 137–147)
TCO2: 19 mmol/L (ref 0–100)

## 2013-10-08 LAB — URINALYSIS, ROUTINE W REFLEX MICROSCOPIC
GLUCOSE, UA: NEGATIVE mg/dL
Hgb urine dipstick: NEGATIVE
KETONES UR: NEGATIVE mg/dL
NITRITE: NEGATIVE
PH: 5.5 (ref 5.0–8.0)
Protein, ur: 30 mg/dL — AB
SPECIFIC GRAVITY, URINE: 1.027 (ref 1.005–1.030)
Urobilinogen, UA: 1 mg/dL (ref 0.0–1.0)

## 2013-10-08 LAB — TROPONIN I: Troponin I: <0.3 ng/mL (ref <0.30)

## 2013-10-08 LAB — CBC
HCT: 27.8 % — ABNORMAL LOW (ref 39.0–52.0)
Hemoglobin: 8.5 g/dL — ABNORMAL LOW (ref 13.0–17.0)
MCH: 29 pg (ref 26.0–34.0)
MCHC: 30.6 g/dL (ref 30.0–36.0)
MCV: 94.9 fL (ref 78.0–100.0)
Platelets: 228 10*3/uL (ref 150–400)
RBC: 2.93 MIL/uL — AB (ref 4.22–5.81)
RDW: 16.1 % — ABNORMAL HIGH (ref 11.5–15.5)
WBC: 11 10*3/uL — AB (ref 4.0–10.5)

## 2013-10-08 LAB — CBC WITH DIFFERENTIAL/PLATELET
Basophils Absolute: 0 10*3/uL (ref 0.0–0.1)
Basophils Relative: 0 % (ref 0–1)
EOS ABS: 0 10*3/uL (ref 0.0–0.7)
EOS PCT: 0 % (ref 0–5)
HEMATOCRIT: 26.3 % — AB (ref 39.0–52.0)
Hemoglobin: 8 g/dL — ABNORMAL LOW (ref 13.0–17.0)
LYMPHS ABS: 0.6 10*3/uL — AB (ref 0.7–4.0)
LYMPHS PCT: 4 % — AB (ref 12–46)
MCH: 28.7 pg (ref 26.0–34.0)
MCHC: 30.4 g/dL (ref 30.0–36.0)
MCV: 94.3 fL (ref 78.0–100.0)
MONO ABS: 1.9 10*3/uL — AB (ref 0.1–1.0)
Monocytes Relative: 12 % (ref 3–12)
Neutro Abs: 13 10*3/uL — ABNORMAL HIGH (ref 1.7–7.7)
Neutrophils Relative %: 84 % — ABNORMAL HIGH (ref 43–77)
PLATELETS: 235 10*3/uL (ref 150–400)
RBC: 2.79 MIL/uL — ABNORMAL LOW (ref 4.22–5.81)
RDW: 16.1 % — AB (ref 11.5–15.5)
WBC: 14.6 10*3/uL — AB (ref 4.0–10.5)

## 2013-10-08 LAB — SAMPLE TO BLOOD BANK

## 2013-10-08 LAB — COMPREHENSIVE METABOLIC PANEL
ALBUMIN: 2.9 g/dL — AB (ref 3.5–5.2)
ALT: 8 U/L (ref 0–53)
AST: 14 U/L (ref 0–37)
Alkaline Phosphatase: 71 U/L (ref 39–117)
BUN: 22 mg/dL (ref 6–23)
CALCIUM: 8 mg/dL — AB (ref 8.4–10.5)
CO2: 19 mEq/L (ref 19–32)
CREATININE: 1.32 mg/dL (ref 0.50–1.35)
Chloride: 107 mEq/L (ref 96–112)
GFR calc non Af Amer: 49 mL/min — ABNORMAL LOW (ref >90)
GFR, EST AFRICAN AMERICAN: 57 mL/min — AB (ref >90)
GLUCOSE: 163 mg/dL — AB (ref 70–99)
Potassium: 4.6 mEq/L (ref 3.7–5.3)
Sodium: 141 mEq/L (ref 137–147)
TOTAL PROTEIN: 6.2 g/dL (ref 6.0–8.3)
Total Bilirubin: 2 mg/dL — ABNORMAL HIGH (ref 0.3–1.2)

## 2013-10-08 LAB — PREPARE RBC (CROSSMATCH)

## 2013-10-08 LAB — POC OCCULT BLOOD, ED: Fecal Occult Bld: POSITIVE — AB

## 2013-10-08 LAB — I-STAT CG4 LACTIC ACID, ED: Lactic Acid, Venous: 2.14 mmol/L (ref 0.5–2.2)

## 2013-10-08 LAB — URINE MICROSCOPIC-ADD ON

## 2013-10-08 LAB — I-STAT TROPONIN, ED: TROPONIN I, POC: 0.01 ng/mL (ref 0.00–0.08)

## 2013-10-08 LAB — MRSA PCR SCREENING: MRSA by PCR: NEGATIVE

## 2013-10-08 LAB — ABO/RH: ABO/RH(D): O NEG

## 2013-10-08 LAB — GLUCOSE, CAPILLARY: Glucose-Capillary: 95 mg/dL (ref 70–99)

## 2013-10-08 MED ORDER — SODIUM CHLORIDE 0.9 % IV SOLN
8.0000 mg/h | INTRAVENOUS | Status: DC
  Administered 2013-10-08: 8 mg/h via INTRAVENOUS
  Filled 2013-10-08 (×4): qty 80

## 2013-10-08 MED ORDER — SODIUM CHLORIDE 0.9 % IV BOLUS (SEPSIS): 1000.0000 mL | Freq: Once | INTRAVENOUS | Status: DC

## 2013-10-08 MED ORDER — LEVOTHYROXINE SODIUM 50 MCG PO TABS
50.0000 ug | ORAL_TABLET | Freq: Every day | ORAL | Status: DC
  Administered 2013-10-09 – 2013-10-12 (×4): 50 ug via ORAL
  Filled 2013-10-08 (×6): qty 1

## 2013-10-08 MED ORDER — SODIUM CHLORIDE 0.9 % IV SOLN
INTRAVENOUS | Status: DC
  Administered 2013-10-08: 100 mL/h via INTRAVENOUS

## 2013-10-08 MED ORDER — SODIUM CHLORIDE 0.9 % IV SOLN
INTRAVENOUS | Status: DC
  Administered 2013-10-09: 500 mL via INTRAVENOUS

## 2013-10-08 MED ORDER — MORPHINE SULFATE 2 MG/ML IJ SOLN: 1.0000 mg | INTRAMUSCULAR | Status: DC | PRN

## 2013-10-08 MED ORDER — AMIODARONE HCL 200 MG PO TABS
200.0000 mg | ORAL_TABLET | Freq: Every evening | ORAL | Status: DC
  Administered 2013-10-08 – 2013-10-11 (×4): 200 mg via ORAL
  Filled 2013-10-08 (×7): qty 1

## 2013-10-08 MED ORDER — CITALOPRAM HYDROBROMIDE 20 MG PO TABS
20.0000 mg | ORAL_TABLET | Freq: Every evening | ORAL | Status: DC
  Administered 2013-10-08 – 2013-10-11 (×4): 20 mg via ORAL
  Filled 2013-10-08 (×7): qty 1

## 2013-10-08 MED ORDER — ACETAMINOPHEN 650 MG RE SUPP: 650.0000 mg | Freq: Four times a day (QID) | RECTAL | Status: DC | PRN

## 2013-10-08 MED ORDER — ACETAMINOPHEN 325 MG PO TABS
650.0000 mg | ORAL_TABLET | Freq: Four times a day (QID) | ORAL | Status: DC | PRN
  Administered 2013-10-12: 650 mg via ORAL
  Filled 2013-10-08: qty 2

## 2013-10-08 MED ORDER — SIMVASTATIN 40 MG PO TABS
40.0000 mg | ORAL_TABLET | Freq: Every evening | ORAL | Status: DC
  Administered 2013-10-08: 40 mg via ORAL
  Filled 2013-10-08 (×2): qty 1

## 2013-10-08 MED ORDER — AZITHROMYCIN 250 MG PO TABS
500.0000 mg | ORAL_TABLET | Freq: Once | ORAL | Status: AC
Start: 2013-10-08 — End: 2013-10-08
  Administered 2013-10-08: 500 mg via ORAL
  Filled 2013-10-08: qty 2

## 2013-10-08 MED ORDER — SODIUM CHLORIDE 0.9 % IV BOLUS (SEPSIS)
1000.0000 mL | Freq: Once | INTRAVENOUS | Status: AC
  Administered 2013-10-08: 1000 mL via INTRAVENOUS

## 2013-10-08 MED ORDER — SODIUM CHLORIDE 0.9 % IV SOLN: INTRAVENOUS | Status: DC

## 2013-10-08 MED ORDER — PANTOPRAZOLE SODIUM 40 MG IV SOLR: 40.0000 mg | Freq: Two times a day (BID) | INTRAVENOUS | Status: DC

## 2013-10-08 MED ORDER — SODIUM CHLORIDE 0.9 % IV SOLN
80.0000 mg | Freq: Once | INTRAVENOUS | Status: AC
  Administered 2013-10-08: 80 mg via INTRAVENOUS
  Filled 2013-10-08: qty 80

## 2013-10-08 MED ORDER — DEXTROSE 5 % IV SOLN
1.0000 g | Freq: Once | INTRAVENOUS | Status: AC
  Administered 2013-10-08: 1 g via INTRAVENOUS
  Filled 2013-10-08: qty 10

## 2013-10-08 MED ORDER — SODIUM CHLORIDE 0.9 % IJ SOLN
3.0000 mL | Freq: Two times a day (BID) | INTRAMUSCULAR | Status: DC
  Administered 2013-10-09 – 2013-10-12 (×6): 3 mL via INTRAVENOUS

## 2013-10-08 NOTE — ED Notes (Signed)
Family at bedside. 

## 2013-10-08 NOTE — H&P (Signed)
80 year old male with PMH of CAD, afib on Xarelto, mild dementia, hypothyroidism, COPD presents to ED with lightheadedness * 1 week . Patient states that for the past 1 week he has noted lightheadedness on ambulation but denies syncope. He also noticed progressive SOB and DOE. He denies cough or fevers/chills. He did complain of blackish stools for approx 1 week. His SOB and lightheadedness improved with rest. No CP, no diarrhea, no HA, no blurry vision, no fevers or chills. Patient also complaining of mild diffuse abd pain, non radiating, no aggravating or relieving factors. In ED patient found to be hypotensive and anemic with fecal occult blood +.Remaining ROS negative  Cardiovascular- normal heart sounds, regular arte and rhythm Lungs - CTA b/l, no crackles/rales Abdomen- soft, mild diffuse tenderness, no rebound/guarding Extremities- no lower extremity edema General- Awake, alert, oriented, forgetful at times  Case d/w Dr. Redmond Pulling and patient as well as his grandaughter - [name] at bedside in detail  Acute blood loss anemia secondary to GI bleed - would monitor patient in step down unit - monitor CBC q 8 hours -- clear liquid diet for now, NPO after midnight - GI consulted- patient for EGD in AM - Would transfuse 1 more unit PRBC tonight - Hg decreased from 14 in December to 8 on admission.  - continue with IV PPI 2 *day  Hypovolemic shock - Hypotension now resolved s/p 1 unit RBC and fluids - maintain 2 large bore IV's - Continue with NS @ 100 cc/hr for now - Monitor BP closely  Likely CKD - pt with Cr of 1/3- 1.6 on previous bloodwork - will monitor for now. If worsening Creatinine will consider further work up and renal consult - patient at risk for ATN given hypotension  Afib, CAD - Hold xarelto and asa as well as anti hypertensives given GI bleed and hypotension - will consider resuming amiodarone  - patient complained of CP to resident but denied to me- check troponins and  monitor   Acute bacterial PNA - will treat for CAP with rocephin, azithromycin for now - monitor O2 sats  Hypothyroidism - continue with levothyroxine - check TSH

## 2013-10-08 NOTE — ED Notes (Signed)
Pt c/o sob x 1 week.  Increases with movement.  Sats of 92% on RA.  Denies chest pain.

## 2013-10-08 NOTE — H&P (Signed)
Date: 10/08/2013               Patient Name:  Wesley Wang MRN: [id number]  DOB: 09/17/1931 Age / Sex: 80 y.o., male   PCP: Leamon Arnt, MD         Medical Service: Internal Medicine Teaching Service         Attending Physician: Dr. Aldine Contes, MD    First Contact: Dr. [name] Pager: [phone]  Second Contact: Dr. Clinton Gallant Pager: [phone]       After Hours (After 5p/  First Contact Pager: [phone]  weekends / holidays): Second Contact Pager: [phone]   Chief Complaint: dyspnea and dark stools  History of Present Illness: Wesley Wang is an 80 year old male resident of an assisted living facility with a PMH of CAD, Atrial fibrillation on Xarelto, CKD, benign essential tremor and hx of Hodgkin's lymphoma.  He presents to the ED with a several week (not quite 1 month) history of DOE and dark stools.  He also notes some lightheadedness.  He denies N/V, change in appetite, history of PUD, prior GI bleed or upper endoscopy.  He reports occasional AM chest pain lasting ~ 15 minutes and resolving, not at present, has not needed to use NTG.  He denies orthopnea, or lower extremity edema.  He quit smoking over 20 years ago and denies drug or EtOH use.  He reports that he last took Xarelto yesterday.  He also takes a daily ASA but denies other NSAID use.    In the ED:  T97.68F, RR22, SpO2 91% on RA, HR 88, BP 75/21mmHg; he was given 1L NS bolus and started on ceftriaxone and azithromycin. Meds: Current Facility-Administered Medications  Medication Dose Route Frequency Provider Last Rate Last Dose  . sodium chloride 0.9 % bolus 1,000 mL  1,000 mL Intravenous Once Wesley Cater, PA-C       Current Outpatient Prescriptions  Medication Sig Dispense Refill  . amiodarone (PACERONE) 200 MG tablet Take 1 tablet (200 mg total) by mouth every evening.  30 tablet  6  . aspirin 81 MG tablet Take 81 mg by mouth daily as needed for pain.      . carvedilol (COREG) 12.5 MG tablet Take 12.5 mg by  mouth 2 (two) times daily with a meal.       . citalopram (CELEXA) 20 MG tablet Take 20 mg by mouth every evening.       . isosorbide mononitrate (IMDUR) 60 MG 24 hr tablet Take 60 mg by mouth every evening.       Marland Kitchen KLOR-CON M20 20 MEQ tablet Take 20 mEq by mouth 2 (two) times daily.       Marland Kitchen levothyroxine (SYNTHROID, LEVOTHROID) 50 MCG tablet Take 50 mcg by mouth daily before breakfast.       . Rivaroxaban (XARELTO) 15 MG TABS tablet Take 1 tablet (15 mg total) by mouth daily.  30 tablet  11  . simvastatin (ZOCOR) 40 MG tablet Take 40 mg by mouth every evening.       Marland Kitchen NITROSTAT 0.4 MG SL tablet Place 0.4 mg under the tongue every 5 (five) minutes as needed for chest pain.         Allergies: Allergies as of 10/08/2013 - Review Complete 10/08/2013  Allergen Reaction Noted  . Sulfa antibiotics Hives 06/02/2013   Past Medical History  Diagnosis Date  . Heart attack 04/1994 & 12/2001    s/p ptca but has not had  stents or bypass  . Prostate disorder   . Gallstones     Cholecystectomy 2005  . Cataracts, bilateral   . Hernia   . Atrial fibrillation   . Long term (current) use of anticoagulants     on coumadin  . Memory disturbance     (05/20/13) son & pt state note problems w/keeping a schedule. Can't remember what day of the wk its is or Drs appts. Also can't remember if he has taken his meds sometimes. No problems functionally. Son states memory loss is progressive & rapid. Short term problems.  . Chronic kidney disease   . Coronary artery disease     PTCA x 2  . Hypothyroidism   . Hyperlipidemia   . COPD (chronic obstructive pulmonary disease)   . Essential tremor   . Abdominal pain 04/22/13    RUQ  . Hodgkin lymphoma 2003   Past Surgical History  Procedure Laterality Date  . Cholecystectomy  2005  . Cataract extraction, bilateral Bilateral 11/2010, 12/2010  . Ptca  04/1994 & 12/2001    x 2   . Hemorrhoid surgery    . Transurethral resection of prostate     Family History    Problem Relation Age of Onset  . Heart attack Mother   . Heart disease Mother   . Heart attack Brother   . Cancer Brother   . Heart disease Brother    History   Social History  . Marital Status: Married    Spouse Name: N/A    Number of Children: N/A  . Years of Education: N/A   Occupational History  . Not on file.   Social History Main Topics  . Smoking status: Former Smoker    Quit date: 06/11/1990  . Smokeless tobacco: Not on file  . Alcohol Use: No     Comment: remote  . Drug Use: No  . Sexual Activity: Not Currently   Other Topics Concern  . Not on file   Social History Narrative   Pt live in [facility].  Moved to [place] from [place] 7/14.  Widower.  Retired.  Previously worked as an Art gallery manager.  He worked on the [employer].  He contracted through [employer] for [employer].    Review of Systems: Pertinent items are noted in HPI.  Physical Exam: Blood pressure 87/58, pulse 72, temperature 98.2 F (36.8 C), temperature source Oral, resp. rate 22, SpO2 100.00%. General: resting in bed in NAD HEENT: PERRL, EOMI, no scleral icterus Cardiac: RRR, no rubs, murmurs or gallops, 2+ B/L dorsalis pedis and radial pulses Pulm: crackles at right lung base, otherwise clear, moving normal volumes of air Abd: soft, nontender, nondistended, BS present Ext: warm and well perfused, no pedal edema Neuro: alert and oriented X3, cranial nerves II-XII grossly intact, muscle strength equal and intact  Lab results: Basic Metabolic Panel:  Recent Labs  10/08/13 0900 10/08/13 0919  NA 141 141  K 4.6 4.4  CL 107 107  CO2 19  --   GLUCOSE 163* 161*  BUN 22 20  CREATININE 1.32 1.40*  CALCIUM 8.0*  --    Liver Function Tests:  Recent Labs  10/08/13 0900  AST 14  ALT 8  ALKPHOS 71  BILITOT 2.0*  PROT 6.2  ALBUMIN 2.9*   CBC:  Recent Labs  10/08/13 0900 10/08/13 0919  WBC 14.6*  --   NEUTROABS 13.0*  --   HGB 8.0*  8.8*  HCT 26.3* 26.0*  MCV 94.3  --   PLT 235  --    Cardiac Enzymes: poc Troponin negative  FOBT posititve  Imaging results:  Dg Chest Portable 1 View  10/08/2013   CLINICAL DATA:  Shortness of breath.  EXAM: PORTABLE CHEST - 1 VIEW  COMPARISON:  06/18/2010 and report of chest x-ray dated 11/28/2011  FINDINGS: There is an area of consolidative infiltrate in the right upper lobe. Heart size and pulmonary vascularity are within normal limits considering the AP portable technique.  There is a calcified granuloma in the left midzone.  No effusions. No acute osseous abnormality. Old fracture of the left clavicle. Chronic rotator cuff tear in the right shoulder.  IMPRESSION: Infiltrate in the right upper lobe. I recommend follow-up chest x-ray to ensure clearing and to exclude an underlying mass.   Electronically Signed   By: [name] M.D.   On: 10/08/2013 10:28    Assessment & Plan by Problem: 80 year old male resident of an assisted living facility with a PMH of CAD, Atrial fibrillation on Xarelto, CKD, benign essential tremor and hx of Hodgkin's lymphoma presenting with ~ 3 week history of DOE and dark stools.  UGIB:  Melena is c/w upper GI etiology.  Likely precipitated by Xarelto use.  GI consulted and recommendations appreciated. - admit to SDU - NS at 100cc/hr - clear liquid diet - plan for EGD once Xarelto out of system (possibly tomorrow afternoon)   Acute blood loss anemia:   No hx of anemia.  Hgb 8.0.  Transfused 1 unit PRBCs in the ED.   - follow-up post-transfusion CBC and then CBC q8h; change frequency as needed - transfuse PRBCs if Hgb < 8.0 given CAD history  Hypotension:  2/2 to blood loss; responding to IV fluids and PRBCs  - continue IVF and PRBCs as above  Pneumonia:  RUL infiltrate on portable 1-view; asymptomatic-denies cough, increased mucous, afebrile; + leukocytosis (14.6). - supplemental oxygen prn - ceftriaxone and azithromax - AM 2-view CXR - monitor WBC  and for fever  CAD:  Co chest pain currently. - monitor on telemetry  - EKG - troponin q6h x 3 - continue simvastatin - hold ASA in the setting of UGIB - hold Coreg and Imdur in the setting of hypotension  Atrial fibrillation:  Rate controlled 60-70s.   - monitor on telemetry - EKG - continue amiodarone - hold Xarelto in the setting of UGIB and in anticipation of EGD - hold Coreg in the setting of hypotension  AKI on CKD3:  Baseline Cr 1.3. Admission Cr 1.32-->1.4.  Close to baseline, slight increase likely 2/2 to hypovolemia 2/2 to blood loss. - monitor renal function  Hypothyroidism: continue Synthroid  VTE ppx:  SCDs, no pharmacologic VTE ppx given GIB. Diet:  Clear liquids now, NPO past MN for EGD tomorrow Code:  Full  Dispo: Disposition is deferred at this time, awaiting improvement of current medical problems. Anticipated discharge in approximately 1-2 day(s).   The patient does have a current PCP Leamon Arnt, MD) and does not need an [facility] hospital follow-up appointment after discharge.  The patient does not know have transportation limitations that hinder transportation to clinic appointments.  Signed: [name], DO 10/08/2013, 1:56 PM

## 2013-10-08 NOTE — ED Notes (Signed)
Admitting Physician at the bedside.  

## 2013-10-08 NOTE — ED Provider Notes (Signed)
CSN: [id number]     Arrival date & time 10/08/13  0831 History   First MD Initiated Contact with Patient 10/08/13 [phone]     Chief Complaint  Patient presents with  . Shortness of Breath     (Consider location/radiation/quality/duration/timing/severity/associated sxs/prior Treatment) HPI Comments: Patient with history of CAD, A. fib on Xarelto and amiodarone -- presents with complaint of several days of shortness of breath, increasing with exertion. Patient was much more short of breath this morning. He denies having any significant prolonged chest pain. He has had some mild abdominal pain, abdominal distention. Patient states that his stools have been black for several days. He has not been seen for this. He continues to take his Xarelto, last dose 9:30PM yesterday. He feels lightheaded with standing but has not passed out. Symptoms are improved with rest. Onset of symptoms gradual. Course is worsening. Nothing makes symptoms better or worse.  Patient is a 80 y.o. male presenting with shortness of breath. The history is provided by the patient and medical records.  Shortness of Breath Associated symptoms: abdominal pain   Associated symptoms: no chest pain, no cough, no fever, no headaches, no rash, no sore throat, no vomiting and no wheezing     Past Medical History  Diagnosis Date  . Heart attack 04/1994 & 12/2001    s/p ptca but has not had stents or bypass  . Prostate disorder   . Gallstones     Cholecystectomy 2005  . Cataracts, bilateral   . Hernia   . Atrial fibrillation   . Long term (current) use of anticoagulants     on coumadin  . Memory disturbance     (05/20/13) son & pt state note problems w/keeping a schedule. Can't remember what day of the wk its is or Drs appts. Also can't remember if he has taken his meds sometimes. No problems functionally. Son states memory loss is progressive & rapid. Short term problems.  . Chronic kidney disease   . Coronary artery disease      PTCA x 2  . Hypothyroidism   . Hyperlipidemia   . COPD (chronic obstructive pulmonary disease)   . Essential tremor   . Abdominal pain 04/22/13    RUQ  . Hodgkin lymphoma 2003   Past Surgical History  Procedure Laterality Date  . Cholecystectomy  2005  . Cataract extraction, bilateral Bilateral 11/2010, 12/2010  . Ptca  04/1994 & 12/2001    x 2   . Hemorrhoid surgery    . Transurethral resection of prostate     Family History  Problem Relation Age of Onset  . Heart attack Mother   . Heart disease Mother   . Heart attack Brother   . Cancer Brother   . Heart disease Brother    History  Substance Use Topics  . Smoking status: Former Smoker    Quit date: 06/11/1990  . Smokeless tobacco: Not on file  . Alcohol Use: No     Comment: remote    Review of Systems  Constitutional: Negative for fever and chills.  HENT: Negative for rhinorrhea and sore throat.   Eyes: Negative for redness.  Respiratory: Positive for shortness of breath. Negative for cough, chest tightness and wheezing.   Cardiovascular: Negative for chest pain.  Gastrointestinal: Positive for abdominal pain, blood in stool (melena) and abdominal distention. Negative for nausea, vomiting and diarrhea.  Genitourinary: Negative for dysuria and decreased urine volume.  Musculoskeletal: Negative for myalgias.  Skin: Negative for rash.  Neurological: Negative for headaches.    Allergies  Sulfa antibiotics  Home Medications   Prior to Admission medications   Medication Sig Start Date End Date Taking? Authorizing Provider  amiodarone (PACERONE) 200 MG tablet Take 1 tablet (200 mg total) by mouth every evening. 07/31/13   Thompson Grayer, MD  carvedilol (COREG) 12.5 MG tablet Take 12.5 mg by mouth 2 (two) times daily with a meal.  05/02/13   Historical Provider, MD  citalopram (CELEXA) 20 MG tablet Take 20 mg by mouth every evening.  05/18/13   Historical Provider, MD  isosorbide mononitrate (IMDUR) 60 MG 24 hr tablet  Take 60 mg by mouth every evening.  05/17/13   Historical Provider, MD  KLOR-CON M20 20 MEQ tablet Take 20 mEq by mouth 2 (two) times daily.  04/28/13   Historical Provider, MD  levothyroxine (SYNTHROID, LEVOTHROID) 50 MCG tablet Take 50 mcg by mouth daily before breakfast.  05/05/13   Historical Provider, MD  NITROSTAT 0.4 MG SL tablet Place 0.4 mg under the tongue every 5 (five) minutes as needed for chest pain.  03/24/13   Historical Provider, MD  Rivaroxaban (XARELTO) 15 MG TABS tablet Take 1 tablet (15 mg total) by mouth daily. 06/25/13   Thompson Grayer, MD  simvastatin (ZOCOR) 40 MG tablet Take 40 mg by mouth every evening.  05/06/13   Historical Provider, MD   BP 75/41  Pulse 88  Temp(Src) 97.9 F (36.6 C) (Oral)  Resp 22  SpO2 91%  Physical Exam  Nursing note and vitals reviewed. Constitutional: He is oriented to person, place, and time. He appears well-developed and well-nourished.  HENT:  Head: Normocephalic and atraumatic.  Mouth/Throat: Oropharynx is clear and moist.  Eyes: Pupils are equal, round, and reactive to light. Right eye exhibits no discharge. Left eye exhibits no discharge.  Conjunctiva pale.   Neck: Normal range of motion. Neck supple. No JVD present.  Cardiovascular: Normal rate, regular rhythm and normal heart sounds.   No murmur heard. Pulmonary/Chest: Effort normal and breath sounds normal. No respiratory distress. He has no wheezes. He has no rales.  Abdominal: Soft. He exhibits distension. There is tenderness (mild, generalized). There is no rebound and no guarding.  Genitourinary: Rectal exam shows no external hemorrhoid, no internal hemorrhoid, no fissure, no mass, no tenderness and anal tone normal. Guaiac positive stool.  Musculoskeletal: He exhibits no edema and no tenderness.  Neurological: He is alert and oriented to person, place, and time.  Skin: Skin is warm and dry. There is pallor.  Psychiatric: He has a normal mood and affect.    ED Course   Procedures (including critical care time)  Labs Review Labs Reviewed  CBC WITH DIFFERENTIAL - Abnormal; Notable for the following:    WBC 14.6 (*)    RBC 2.79 (*)    Hemoglobin 8.0 (*)    HCT 26.3 (*)    RDW 16.1 (*)    Neutrophils Relative % 84 (*)    Neutro Abs 13.0 (*)    Lymphocytes Relative 4 (*)    Lymphs Abs 0.6 (*)    Monocytes Absolute 1.9 (*)    All other components within normal limits  COMPREHENSIVE METABOLIC PANEL - Abnormal; Notable for the following:    Glucose, Bld 163 (*)    Calcium 8.0 (*)    Albumin 2.9 (*)    Total Bilirubin 2.0 (*)    GFR calc non Af Amer 49 (*)    GFR calc Af Amer 57 (*)  All other components within normal limits  I-STAT CHEM 8, ED - Abnormal; Notable for the following:    Creatinine, Ser 1.40 (*)    Glucose, Bld 161 (*)    Hemoglobin 8.8 (*)    HCT 26.0 (*)    All other components within normal limits  POC OCCULT BLOOD, ED - Abnormal; Notable for the following:    Fecal Occult Bld POSITIVE (*)    All other components within normal limits  URINALYSIS, ROUTINE W REFLEX MICROSCOPIC  OCCULT BLOOD X 1 CARD TO LAB, STOOL  I-STAT TROPOININ, ED  I-STAT CG4 LACTIC ACID, ED  SAMPLE TO BLOOD BANK  TYPE AND SCREEN  PREPARE RBC (CROSSMATCH)  ABO/RH    Imaging Review Dg Chest Portable 1 View  10/08/2013   CLINICAL DATA:  Shortness of breath.  EXAM: PORTABLE CHEST - 1 VIEW  COMPARISON:  06/18/2010 and report of chest x-ray dated 11/28/2011  FINDINGS: There is an area of consolidative infiltrate in the right upper lobe. Heart size and pulmonary vascularity are within normal limits considering the AP portable technique.  There is a calcified granuloma in the left midzone.  No effusions. No acute osseous abnormality. Old fracture of the left clavicle. Chronic rotator cuff tear in the right shoulder.  IMPRESSION: Infiltrate in the right upper lobe. I recommend follow-up chest x-ray to ensure clearing and to exclude an underlying mass.    Electronically Signed   By: [name] M.D.   On: 10/08/2013 10:28     EKG Interpretation None      8:59 AM Patient seen and examined. Bolus started for hypotension. Oxygen 4L [place] applied for hypoxia, mid-80% on RA. Patient is alert and oriented. Work-up initiated. D/w Dr. Regenia Skeeter who will see.    Vital signs reviewed and are as follows: Filed Vitals:   10/08/13 0839  BP: 75/41  Pulse: 88  Temp: 97.9 F (36.6 C)  Resp: 22   9:24 AM Pt seen by Dr. Regenia Skeeter. Hgb 8.8 on istat 8.   10:10 AM Hemoccult grossly positive, dark red/black stool. Blood ordered given symptoms. BP improved into 44'Y systolic. Additional liter of NS ordered. Patient stable, continues to be conversant and joke at bedside. No PCP.   Spoke with Azucena Freed of gastroenterology who will see.    10:32 AM Patient remains stable. IMTS to see and admit. Given hypoxia and CXR showing possible infiltrate, abx ordered to cover for CAP.   CRITICAL CARE Performed by: Carlisle Cater PA-C Total critical care time: 45 Critical care time was exclusive of separately billable procedures and treating other patients. Critical care was necessary to treat or prevent imminent or life-threatening deterioration. Critical care was time spent personally by me on the following activities: development of treatment plan with patient and/or surrogate as well as nursing, discussions with consultants, evaluation of patient's response to treatment, examination of patient, obtaining history from patient or surrogate, ordering and performing treatments and interventions, ordering and review of laboratory studies, ordering and review of radiographic studies, pulse oximetry and re-evaluation of patient's condition.    MDM   Final diagnoses:  GI bleed  Hypotension  Hypoxia   Admit for above.     Carlisle Cater, PA-C 10/08/13 [address] [place] 10/08/13 1050

## 2013-10-08 NOTE — ED Notes (Signed)
Pt is pale, conjunctiva pale,

## 2013-10-08 NOTE — Progress Notes (Signed)
Pt arrived from ED per stretcher transferred to bed VS done and placed on monitor.

## 2013-10-08 NOTE — Consult Note (Signed)
Patient seen, examined, and I agree with the above documentation, including the assessment and plan. Pt with likely UGI bleeding in the setting of Xarelto, last dose last pm Hgb 8 down from 14 in Dec 2014 Pt's granddaughter, [name], at bedside.  Reports pt has colonoscopy 3-5 yrs ago in [place], [place].  Reported this was normal and unremarkable Plan EGD tomorrow for evaluation of GI bleeding Serial Hgb, transfuse if needed Maintain 2 large bore PIV PPI

## 2013-10-08 NOTE — ED Notes (Signed)
Attempted to call report x 1  

## 2013-10-08 NOTE — Consult Note (Signed)
[facility] Consult: 11:44 AM 10/08/2013  LOS: 0 days    Referring Provider: PA in ED  [facility] Primary Care Physician:  Leamon Arnt, MD established care last year after relocating from [place].  Primary Gastroenterologist:  unassigned   Reason for Consultation:  GI bleed, anemia.     HPI: Wesley Wang is a 80 y.o. male.  On Xarelto for a fib since Jan 2015, on Coumadin before this.  S/p cardiac stenting x 2.  Hx Hodgkins lymphoma about 9 years ago.    Dark stool for 2 days.  These are loose.  SOB in AM for 2 weeks, worse in recent days. Some lightheadedness.  Brought to ED from his independent living facility this AM with SOB.  Hgb 8.8, MCV.  Stool dark and FOBT+.  SBP in 90s, improved post IVF.  No nausea, no anorexia, no dysphagia.  Does not recall hx of colnoscopy or EGD and nothing found in records from [place].   No weight loss.  No bruising, no nose bleeds.  No Hx GIB or GI testing.  Takes Aleve a few times per month. No ASA.  No nausea.  Eats well.  No dysphagia.  No heartburn.     Past Medical History  Diagnosis Date  . Heart attack 04/1994 & 12/2001    s/p ptca but has not had stents or bypass  . Prostate disorder   . Gallstones     Cholecystectomy 2005  . Cataracts, bilateral   . Hernia   . Atrial fibrillation   . Long term (current) use of anticoagulants     on coumadin  . Memory disturbance     (05/20/13) son & pt state note problems w/keeping a schedule. Can't remember what day of the wk its is or Drs appts. Also can't remember if he has taken his meds sometimes. No problems functionally. Son states memory loss is progressive & rapid. Short term problems.  . Chronic kidney disease   . Coronary artery disease     PTCA x 2  . Hypothyroidism   . Hyperlipidemia   . COPD  (chronic obstructive pulmonary disease)   . Essential tremor   . Abdominal pain 04/22/13    RUQ  . Hodgkin lymphoma 2003    Past Surgical History  Procedure Laterality Date  . Cholecystectomy  2005  . Cataract extraction, bilateral Bilateral 11/2010, 12/2010  . Ptca  04/1994 & 12/2001    x 2   . Hemorrhoid surgery    . Transurethral resection of prostate      Prior to Admission medications   Medication Sig Start Date End Date Taking? Authorizing Provider  amiodarone (PACERONE) 200 MG tablet Take 1 tablet (200 mg total) by mouth every evening. 07/31/13  Yes Thompson Grayer, MD  aspirin 81 MG tablet Take 81 mg by mouth daily as needed for pain.   Yes Historical Provider, MD  carvedilol (COREG) 12.5 MG tablet Take 12.5 mg by mouth 2 (two) times daily with a meal.  05/02/13  Yes Historical Provider, MD  citalopram (CELEXA)  20 MG tablet Take 20 mg by mouth every evening.  05/18/13  Yes Historical Provider, MD  isosorbide mononitrate (IMDUR) 60 MG 24 hr tablet Take 60 mg by mouth every evening.  05/17/13  Yes Historical Provider, MD  KLOR-CON M20 20 MEQ tablet Take 20 mEq by mouth 2 (two) times daily.  04/28/13  Yes Historical Provider, MD  levothyroxine (SYNTHROID, LEVOTHROID) 50 MCG tablet Take 50 mcg by mouth daily before breakfast.  05/05/13  Yes Historical Provider, MD  Rivaroxaban (XARELTO) 15 MG TABS tablet Take 1 tablet (15 mg total) by mouth daily. 06/25/13  Yes Thompson Grayer, MD  simvastatin (ZOCOR) 40 MG tablet Take 40 mg by mouth every evening.  05/06/13  Yes Historical Provider, MD  NITROSTAT 0.4 MG SL tablet Place 0.4 mg under the tongue every 5 (five) minutes as needed for chest pain.  03/24/13   Historical Provider, MD    Scheduled Meds:   Infusions: . sodium chloride     PRN Meds:    Allergies as of 10/08/2013 - Review Complete 10/08/2013  Allergen Reaction Noted  . Sulfa antibiotics Hives 06/02/2013    Family History  Problem Relation Age of Onset  . Heart attack  Mother   . Heart disease Mother   . Heart attack Brother   . Cancer Brother   . Heart disease Brother     History   Social History  . Marital Status: Married    Spouse Name: N/A    Number of Children: N/A  . Years of Education: N/A   Occupational History  . Not on file.   Social History Main Topics  . Smoking status: Former Smoker    Quit date: 06/11/1990  . Smokeless tobacco: Not on file  . Alcohol Use: No     Comment: remote  . Drug Use: No  . Sexual Activity: Not Currently   Other Topics Concern  . Not on file   Social History Narrative   Pt live in [place] independent living.  Moved to [place] from [place] 7/14.  Widower.  Retired.  Previously worked as an Art gallery manager.  He worked on the [employer].  He contracted through [employer] for [employer].    REVIEW OF SYSTEMS: Constitutional:  Sedenatary, no exercise.  Weight stable ENT:  No nose bleeds Pulm:  Per HPI.  Dry cough, stable CV:  No palpitations, no LE edema.  GU:  No hematuria, no frequency GI:  Per HPI Heme:  Told to take iron in past   Transfusions:  None ever Neuro:  No headaches, no peripheral tingling or numbness. Balance issues, does not use his cane.  Fell last week.  Derm:  No itching, no rash or sores.  Endocrine:  No sweats or chills.  No polyuria or dysuria Immunization:  Not queried.  Travel:  None beyond local counties in last few months.    PHYSICAL EXAM: Vital signs in last 24 hours: Filed Vitals:   10/08/13 1024  BP: 106/51  Pulse: 68  Temp:   Resp: 20   Wt Readings from Last 3 Encounters:  06/11/13 78.2 kg (172 lb 6.4 oz)    General: overwight, pale but well appearing elderly WM.  comfortable Head:  No asymmetry or swelling Eyes:  + conj pallor Ears:  +HOH  Nose:  No discharge or congestion Mouth:  Full upper, lower partial dentures.  Moist, clear oral MM Neck:  No mass, no JVD, no bruits Lungs:  Clear.  Breathing unlabored.  Rare dry  cough Heart: RRR.Marland Kitchen No mrg Abdomen:  Soft, + RUQ ventral hernia.  No mass, no HSM.   Rectal: not repeated, stool is dark and FOBT +   Musc/Skeltl: no joint swelling or contracture Extremities:  No CCE  Neurologic:  Oriented to self and hospital, trouble with dates Skin:  No telangectasia or rash.  No sores Tattoos:  none Nodes:  No cervical adenopathy   Psych:  Cooperative, alert, pleasant, relaxed  Intake/Output from previous day:   Intake/Output this shift:    LAB RESULTS:  Recent Labs  10/08/13 0900 10/08/13 0919  WBC 14.6*  --   HGB 8.0* 8.8*  HCT 26.3* 26.0*  PLT 235  --    BMET Lab Results  Component Value Date   NA 141 10/08/2013   NA 141 10/08/2013   NA 142 06/18/2010   K 4.4 10/08/2013   K 4.6 10/08/2013   K 4.6 06/18/2010   CL 107 10/08/2013   CL 107 10/08/2013   CL 107 06/18/2010   CO2 19 10/08/2013   CO2 23 06/18/2010   CO2 30 09/01/2009   GLUCOSE 161* 10/08/2013   GLUCOSE 163* 10/08/2013   GLUCOSE 131* 06/18/2010   BUN 20 10/08/2013   BUN 22 10/08/2013   BUN 28* 06/18/2010   CREATININE 1.40* 10/08/2013   CREATININE 1.32 10/08/2013   CREATININE 1.3 06/18/2010   CALCIUM 8.0* 10/08/2013   CALCIUM 8.6 06/18/2010   CALCIUM 8.9 09/01/2009   LFT  Recent Labs  10/08/13 0900  PROT 6.2  ALBUMIN 2.9*  AST 14  ALT 8  ALKPHOS 71  BILITOT 2.0*   PT/INR none  RADIOLOGY STUDIES: Dg Chest Portable 1 View  10/08/2013   CLINICAL DATA:  Shortness of breath.  EXAM: PORTABLE CHEST - 1 VIEW  COMPARISON:  06/18/2010 and report of chest x-ray dated 11/28/2011  FINDINGS: There is an area of consolidative infiltrate in the right upper lobe. Heart size and pulmonary vascularity are within normal limits considering the AP portable technique.  There is a calcified granuloma in the left midzone.  No effusions. No acute osseous abnormality. Old fracture of the left clavicle. Chronic rotator cuff tear in the right shoulder.  IMPRESSION: Infiltrate in the right upper lobe. I recommend follow-up chest  x-ray to ensure clearing and to exclude an underlying mass.   Electronically Signed   By: [name] M.D.   On: 10/08/2013 10:28    ENDOSCOPIC STUDIES: None found.  PMD office searched 29 pages of outside records, no colonoscopy found  IMPRESSION:   *  CAD.  Hx PTCA x 2  *  Chronic Xarelto  Began 06/2013. Last dose last eveniing.  Hx A fib  *  Anemia.  One unit blood ordered transfused per ED PA  *  CKD  *  Dementia.   *  Right upper lobe infiltrate, pt with several days of SOB. Got zithromax and Rocephin doses in ED   PLAN:     *  Likely will need colonoscopy and EGD after Xarelto washout. Should be good to go by tomorrow afternoon but with possible PNA, may want to wait.  *  Follow CBC q 8 to start. . *  Clears.     Vena Rua  10/08/2013, 11:44 AM Pager: [phone]

## 2013-10-09 ENCOUNTER — Encounter (HOSPITAL_COMMUNITY): Payer: Self-pay

## 2013-10-09 ENCOUNTER — Encounter (HOSPITAL_COMMUNITY)
Admission: EM | Disposition: A | Discharge status: Home Health | Payer: Self-pay | Source: Home / Self Care | Attending: Internal Medicine

## 2013-10-09 ENCOUNTER — Inpatient Hospital Stay (HOSPITAL_COMMUNITY): Payer: Medicare Other

## 2013-10-09 DIAGNOSIS — R911 Solitary pulmonary nodule: Secondary | ICD-10-CM

## 2013-10-09 HISTORY — PX: ESOPHAGOGASTRODUODENOSCOPY: SHX5428

## 2013-10-09 LAB — CBC
HCT: 30.7 % — ABNORMAL LOW (ref 39.0–52.0)
HEMATOCRIT: 29.5 % — AB (ref 39.0–52.0)
HEMATOCRIT: 33.7 % — AB (ref 39.0–52.0)
HEMOGLOBIN: 9.3 g/dL — AB (ref 13.0–17.0)
HEMOGLOBIN: 9.8 g/dL — AB (ref 13.0–17.0)
Hemoglobin: 10.8 g/dL — ABNORMAL LOW (ref 13.0–17.0)
MCH: 29.2 pg (ref 26.0–34.0)
MCH: 29.6 pg (ref 26.0–34.0)
MCH: 29.7 pg (ref 26.0–34.0)
MCHC: 31.9 g/dL (ref 30.0–36.0)
MCHC: 31.5 g/dL (ref 30.0–36.0)
MCHC: 32 g/dL (ref 30.0–36.0)
MCV: 92.5 fL (ref 78.0–100.0)
MCV: 92.3 fL (ref 78.0–100.0)
MCV: 93 fL (ref 78.0–100.0)
PLATELETS: 219 10*3/uL (ref 150–400)
Platelets: 210 10*3/uL (ref 150–400)
Platelets: 243 10*3/uL (ref 150–400)
RBC: 3.19 MIL/uL — ABNORMAL LOW (ref 4.22–5.81)
RBC: 3.3 MIL/uL — ABNORMAL LOW (ref 4.22–5.81)
RBC: 3.65 MIL/uL — AB (ref 4.22–5.81)
RDW: 15.8 % — ABNORMAL HIGH (ref 11.5–15.5)
RDW: 16.3 % — ABNORMAL HIGH (ref 11.5–15.5)
RDW: 16.1 % — ABNORMAL HIGH (ref 11.5–15.5)
WBC: 11.2 10*3/uL — ABNORMAL HIGH (ref 4.0–10.5)
WBC: 12.1 10*3/uL — AB (ref 4.0–10.5)
WBC: 15.1 10*3/uL — ABNORMAL HIGH (ref 4.0–10.5)

## 2013-10-09 LAB — COMPREHENSIVE METABOLIC PANEL
ALBUMIN: 2.7 g/dL — AB (ref 3.5–5.2)
ALK PHOS: 65 U/L (ref 39–117)
ALT: 7 U/L (ref 0–53)
AST: 14 U/L (ref 0–37)
BILIRUBIN TOTAL: 3.1 mg/dL — AB (ref 0.3–1.2)
BUN: 21 mg/dL (ref 6–23)
CO2: 19 meq/L (ref 19–32)
Calcium: 7.7 mg/dL — ABNORMAL LOW (ref 8.4–10.5)
Chloride: 109 mEq/L (ref 96–112)
Creatinine, Ser: 1.22 mg/dL (ref 0.50–1.35)
GFR calc Af Amer: 63 mL/min — ABNORMAL LOW (ref >90)
GFR, EST NON AFRICAN AMERICAN: 54 mL/min — AB (ref >90)
Glucose, Bld: 105 mg/dL — ABNORMAL HIGH (ref 70–99)
POTASSIUM: 4.5 meq/L (ref 3.7–5.3)
SODIUM: 140 meq/L (ref 137–147)
Total Protein: 5.9 g/dL — ABNORMAL LOW (ref 6.0–8.3)

## 2013-10-09 LAB — PROTIME-INR
INR: 1.61 — ABNORMAL HIGH (ref 0.00–1.49)
PROTHROMBIN TIME: 18.7 s — AB (ref 11.6–15.2)

## 2013-10-09 LAB — TROPONIN I: Troponin I: <0.3 ng/mL (ref <0.30)

## 2013-10-09 SURGERY — EGD (ESOPHAGOGASTRODUODENOSCOPY)
Anesthesia: Moderate Sedation

## 2013-10-09 MED ORDER — MIDAZOLAM HCL 10 MG/2ML IJ SOLN
INTRAMUSCULAR | Status: DC | PRN
  Administered 2013-10-09: 2 mg via INTRAVENOUS

## 2013-10-09 MED ORDER — MIDAZOLAM HCL 5 MG/ML IJ SOLN
INTRAMUSCULAR | Status: AC
  Filled 2013-10-09: qty 2

## 2013-10-09 MED ORDER — PANTOPRAZOLE SODIUM 40 MG PO TBEC
40.0000 mg | DELAYED_RELEASE_TABLET | Freq: Every day | ORAL | Status: DC
  Administered 2013-10-09 – 2013-10-12 (×4): 40 mg via ORAL
  Filled 2013-10-09 (×4): qty 1

## 2013-10-09 MED ORDER — DEXTROSE 5 % IV SOLN
500.0000 mg | INTRAVENOUS | Status: DC
  Administered 2013-10-09 – 2013-10-11 (×2): 500 mg via INTRAVENOUS
  Filled 2013-10-09 (×3): qty 500

## 2013-10-09 MED ORDER — ATORVASTATIN CALCIUM 20 MG PO TABS
20.0000 mg | ORAL_TABLET | Freq: Every day | ORAL | Status: DC
  Administered 2013-10-09 – 2013-10-11 (×3): 20 mg via ORAL
  Filled 2013-10-09 (×5): qty 1

## 2013-10-09 MED ORDER — PEG-KCL-NACL-NASULF-NA ASC-C 100 G PO SOLR
0.5000 | Freq: Once | ORAL | Status: AC
  Administered 2013-10-10: 100 g via ORAL
  Filled 2013-10-09: qty 1

## 2013-10-09 MED ORDER — DEXTROSE 5 % IV SOLN
1.0000 g | INTRAVENOUS | Status: DC
  Administered 2013-10-09 – 2013-10-11 (×3): 1 g via INTRAVENOUS
  Filled 2013-10-09 (×3): qty 10

## 2013-10-09 MED ORDER — FENTANYL CITRATE 0.05 MG/ML IJ SOLN
INTRAMUSCULAR | Status: AC
  Filled 2013-10-09: qty 2

## 2013-10-09 MED ORDER — PEG-KCL-NACL-NASULF-NA ASC-C 100 G PO SOLR: 1.0000 | Freq: Once | ORAL | Status: DC

## 2013-10-09 MED ORDER — PEG-KCL-NACL-NASULF-NA ASC-C 100 G PO SOLR
0.5000 | Freq: Once | ORAL | Status: AC
  Administered 2013-10-09: 100 g via ORAL
  Filled 2013-10-09 (×2): qty 1

## 2013-10-09 MED ORDER — SODIUM CHLORIDE 0.9 % IV SOLN: INTRAVENOUS | Status: DC

## 2013-10-09 MED ORDER — BUTAMBEN-TETRACAINE-BENZOCAINE 2-2-14 % EX AERO
INHALATION_SPRAY | CUTANEOUS | Status: DC | PRN
  Administered 2013-10-09: 2 via TOPICAL

## 2013-10-09 MED ORDER — DIPHENHYDRAMINE HCL 50 MG/ML IJ SOLN
INTRAMUSCULAR | Status: AC
  Filled 2013-10-09: qty 1

## 2013-10-09 MED ORDER — FENTANYL CITRATE 0.05 MG/ML IJ SOLN
INTRAMUSCULAR | Status: DC | PRN
  Administered 2013-10-09: 25 ug via INTRAVENOUS

## 2013-10-09 MED ORDER — FUROSEMIDE 10 MG/ML IJ SOLN
40.0000 mg | Freq: Once | INTRAMUSCULAR | Status: AC
  Administered 2013-10-09: 40 mg via INTRAVENOUS
  Filled 2013-10-09: qty 4

## 2013-10-09 NOTE — Progress Notes (Signed)
Pt transported to Endo for EGD per bed. Alert/oriented says he's ready to find out where he is bleeding from.

## 2013-10-09 NOTE — Progress Notes (Signed)
Patient seen and examined. Feels better. Complains of occasional wheezing. No cp, no abd pain. Had a BM today which RN described to me as "old blood"    Cardiovascular- normal heart sounds, regular rate and rhythm  Lungs - b/l wheezes + Abdomen- soft, mildly distended, non tender, bowel sounds + Extremities- no lower extremity edema  General- Awake, alert, oriented, forgetful at times   Case d/w Dr. Redmond Pulling and patient as well as his grandaughter - [name] at bedside in detail   Acute blood loss anemia secondary to GI bleed  - Continue to monitor patient in step down unit  - monitor CBC q 8 hours  - clear liquid diet for now, NPO after midnight for colonoscopy  - S/p EGD with no etiology for bleed.  - Hg now improving. Transfuse for Hg<8 - change PPI to PO  - GI recommendations appreciated. For colonoscopy in AM    Hypovolemic shock  - Hypotension now resolved. IVF dc'd  - maintain 2 large bore IV's   - Monitor BP closely. Lasix * 1 dose given for likely fluid overload.   Likely CKD  - pt with Cr of 1/3- 1.6 on previous bloodwork  - Cr at baseline will monitor  Pulmonary nodule - Will need CT chest with contrast to further evaluate once acute issues resolve  Afib, CAD  - Hold xarelto and asa as well as anti hypertensives given GI bleed and hypotension  - Continue with amiodarone  - Troponins negative  Acute bacterial PNA  - will treat for CAP with rocephin, azithromycin for now  - monitor O2 sats   Hypothyroidism  - continue with levothyroxine

## 2013-10-09 NOTE — Care Management Note (Addendum)
    Page 1 of 1   10/12/2013     3:04:48 PM CARE MANAGEMENT NOTE 10/12/2013  Patient:  Wesley Wang, Wesley Wang   Account Number:  [id number]  Date Initiated:  10/09/2013  Documentation initiated by:  MAYO,HENRIETTA  Subjective/Objective Assessment:   dx GI Bleed; lives @ [facility]    PCP  Billey Chang     Action/Plan:   Anticipated DC Date:  10/12/2013   Anticipated DC Plan:  [place]  CM consult      Choice offered to / List presented to:  C-4 Adult Children   DME arranged  [place]      DME agency  [facility] arranged  [place].   Status of service:  Completed, signed off Medicare Important Message given?   (If response is "NO", the following Medicare IM given date fields will be blank) Date Medicare IM given:   Date Additional Medicare IM given:  10/12/2013  Discharge Disposition:    Per UR Regulation:  Reviewed for med. necessity/level of care/duration of stay  If discussed at [place] of Stay Meetings, dates discussed:    Comments:

## 2013-10-09 NOTE — Op Note (Signed)
[facility] [place], [id number]   ENDOSCOPY PROCEDURE REPORT  PATIENT: Xiomar, Crompton  MR#: [id number] BIRTHDATE: 05/09/1932 , 82  yrs. old GENDER: Male ENDOSCOPIST: [name], MD REFERRED BY:  [facility] PROCEDURE DATE:  10/09/2013 PROCEDURE:  EGD, diagnostic ASA CLASS:     Class III INDICATIONS:  Acute post hemorrhagic anemia. MEDICATIONS: These medications were titrated to patient response per physician's verbal order, Fentanyl 25 mcg IV, and Versed 2 mg IV TOPICAL ANESTHETIC: Cetacaine Spray  DESCRIPTION OF PROCEDURE: After the risks benefits and alternatives of the procedure were thoroughly explained, informed consent was obtained.  The Pentax Gastroscope [id number] endoscope was introduced through the mouth and advanced to the second portion of the duodenum. Without limitations.  The instrument was slowly withdrawn as the mucosa was fully examined.   ESOPHAGUS: A Schatzki ring was found at the gastroesophageal junction and was widely open.   A 3 cm hiatal hernia was noted. The esophagus was otherwise normal.  STOMACH: The mucosa of the stomach appeared normal.  DUODENUM: The duodenal mucosa showed no abnormalities in the bulb and second portion of the duodenum.  Retroflexed views revealed a hiatal hernia.     The scope was then withdrawn from the patient and the procedure completed.  COMPLICATIONS: There were no complications.  ENDOSCOPIC IMPRESSION: 1.   Schatzki ring was found at the gastroesophageal junction; nonobstructing 2.   3 cm hiatal hernia 3.   The esophagus was otherwise normal. 4.   The mucosa of the stomach appeared normal 5.   The duodenal mucosa showed no abnormalities in the bulb and second portion of the duodenum  RECOMMENDATIONS: No source for GI bleeding, next step would be colonoscopy.  Will discuss with patient and family  eSigned:  [name], MD 10/09/2013 9:47 AM  CC:The  Patient

## 2013-10-09 NOTE — Progress Notes (Signed)
Subjective: No acute overnight events.  Wesley Wang was seen and examined today.  No complaints, specifically no chest pain or dyspnea.  Normal BM this morning but did not look so unsure of the color.  He is agreeable to colonoscopy tomorrow.    Objective: Vital signs in last 24 hours: Filed Vitals:   10/09/13 0950 10/09/13 0955 10/09/13 1024 10/09/13 1151  BP: 113/64 111/74 97/70 114/71  Pulse: 80 80 79 77  Temp:   97.9 F (36.6 C) 97.7 F (36.5 C)  TempSrc:   Oral Oral  Resp: _0 Height:      Weight:      SpO2: 92% 93% 94% 95%   Weight change:   Intake/Output Summary (Last 24 hours) at 10/09/13 1257 Last data filed at 10/09/13 0700  Gross per 24 hour  Intake 2835.83 ml  Output      0 ml  Net 2835.83 ml   General: resting in bed in NAD HEENT: no gross abnormality, mucous membranes moist Cardiac: RRR, no rubs, murmurs or gallops Pulm: mild crackles at bases  Abd: soft, nontender, BS present, + distention Ext: warm and well perfused, no pedal edema Neuro: alert and oriented X3, able to move extremities voluntarily, responding appropriately  Lab Results: Basic Metabolic Panel:  Recent Labs Lab 10/08/13 0900 10/08/13 0919 10/09/13 0054  NA 141 141 140  K 4.6 4.4 4.5  CL 107 107 109  CO2 19  --  19  GLUCOSE 163* 161* 105*  BUN _1 CREATININE 1.32 1.40* 1.22  CALCIUM 8.0*  --  7.7*   Liver Function Tests:  Recent Labs Lab 10/08/13 0900 10/09/13 0054  AST 14 14  ALT 8 7  ALKPHOS 71 65  BILITOT 2.0* 3.1*  PROT 6.2 5.9*  ALBUMIN 2.9* 2.7*   CBC:  Recent Labs Lab 10/08/13 0900  10/09/13 0054 10/09/13 1110  WBC 14.6*  < > 11.2* 12.1*  NEUTROABS 13.0*  --   --   --   HGB 8.0*  < > 9.3* 9.8*  HCT 26.3*  < > 29.5* 30.7*  MCV 94.3  < > 92.5 93.0  PLT 235  < > 210 219  < > = values in this interval not displayed. Cardiac Enzymes:  Recent Labs Lab 10/08/13 2230 10/09/13 0109  TROPONINI <0.30 <0.30   CBG:  Recent Labs Lab  10/08/13 1959  GLUCAP 95   Coagulation:  Recent Labs Lab 10/09/13 0054  LABPROT 18.7*  INR 1.61*    Studies/Results: Dg Chest 2 View  10/09/2013   CLINICAL DATA:  Shortness of breath.  EXAM: CHEST  2 VIEW  COMPARISON:  10/08/2013 and 06/18/2010  FINDINGS: Right upper lobe infiltrate has increased and there is a new infiltrate in the right lower lobe. Heart size and pulmonary vascularity are within normal limits. No effusions. No acute osseous abnormality.  On the lateral view there is a 19 mm nodule overlying the lower thoracic spine. This is new since 06/18/2010.  IMPRESSION: 1. Progressive infiltrates in the right upper and lower lobes. 2. New pulmonary nodule seen posteriorly on the lateral view. It is not identified on the PA view. I recommend a chest CT scan with contrast for further evaluation   Electronically Signed   By: [name] M.D.   On: 10/09/2013 08:37   Dg Chest Portable 1 View  10/08/2013   CLINICAL DATA:  Shortness of breath.  EXAM: PORTABLE CHEST - 1 VIEW  COMPARISON:  06/18/2010 and report of chest x-ray dated 11/28/2011  FINDINGS: There is an area of consolidative infiltrate in the right upper lobe. Heart size and pulmonary vascularity are within normal limits considering the AP portable technique.  There is a calcified granuloma in the left midzone.  No effusions. No acute osseous abnormality. Old fracture of the left clavicle. Chronic rotator cuff tear in the right shoulder.  IMPRESSION: Infiltrate in the right upper lobe. I recommend follow-up chest x-ray to ensure clearing and to exclude an underlying mass.   Electronically Signed   By: [name] M.D.   On: 10/08/2013 10:28   Medications: I have reviewed the patient's current medications. Scheduled Meds: . amiodarone  200 mg Oral QPM  . azithromycin  500 mg Intravenous Q24H  . cefTRIAXone (ROCEPHIN)  IV  1 g Intravenous Q24H  . citalopram  20 mg Oral QPM  . levothyroxine  50 mcg Oral QAC breakfast  .  pantoprazole  40 mg Oral Q0600  . peg 3350 powder  0.5 kit Oral Once   And  . [START ON 10/10/2013] peg 3350 powder  0.5 kit Oral Once  . simvastatin  40 mg Oral QPM  . sodium chloride  1,000 mL Intravenous Once  . sodium chloride  3 mL Intravenous Q12H   Continuous Infusions: None          PRN Meds:.acetaminophen, acetaminophen, morphine injection  Assessment/Plan: 80 year old male resident of an assisted living facility with a PMH of CAD, Atrial fibrillation on Xarelto, CKD, benign essential tremor and hx of Hodgkin's lymphoma presenting with ~ 3 week history of DOE and dark stools.   Acute blood loss anemia 2/2 UGIB: Melena is c/w upper GI etiology. Likely precipitated by Xarelto use.  Hgb 8.0-->9.3 s/p 2 unit PRBCs since admission.   EGD this AM revealed a non-obstructing Schatzki ring at the GE junctions; 3cm hiatal hernia, normal esophagus, stomach and duodenum (bulb and second portion); no upper GI source identified.  Patient reports colonoscopy at [facility]-5 years ago and thinks there may have been a polyp removed but no other abnormality.  Some crackles on exam and mild abdominal distention - likely 2/2 to volume overload from IVF/PRBCs/IV abx. - d/c IVF; $Remove'40mg'[id number]$  IV Lasix once - continue PPI - CBC q8h; change frequency as needed  - transfuse PRBCs if Hgb < 8.0 given CAD history  - NPO past MN for colonoscopy tomorrow - GI following and recommendations appreciated  Hypovolemic shock, resolved:  2/2 to blood loss; improved with IV fluids and 2 units PRBCs  - d/c continue IVF  - transfuse PRBCs prn as above  - monitoring vitals  Pneumonia: RUL and RLL infiltrates on 2-view; asymptomatic-denies cough, increased mucous, afebrile; + leukocytosis (14.6 -->11.2).  - supplemental oxygen prn  - continue ceftriaxone and azithromax  - monitor WBC and for fever   38mm pulmonary nodule - seen posteriorly on lateral CXR view.  CT with contrast recommended for further evaluation. - CT  once acute issues resolved  CAD: No chest pain currently.  EKG ordered but not done.  CE negative x 3. - monitor on telemetry  - EKG  - continue simvastatin  - hold ASA in the setting of UGIB  - hold Coreg and Imdur in the setting of hypotension   Atrial fibrillation: Rate controlled 70-80s.  - monitor on telemetry  - EKG - continue amiodarone  - hold Xarelto in the setting of UGIB and in anticipation of EGD  -  hold Coreg in the setting of hypotension   AKI on CKD3: Baseline Cr 1.3. Admission Cr 1.32-->1.4 Close to baseline, slight increase likely 2/2 to hypovolemia 2/2 to blood loss.  Improved to 1.22 today with fluid/PRBC resuscitation. - continue IV fluids - monitor renal function   Hypothyroidism: continue Synthroid   VTE ppx: SCDs, no pharmacologic VTE ppx given GIB.  Diet: Clear liquids now, NPO past MN for colonoscopy tomorrow  Code: Full  Dispo: Disposition is deferred at this time, awaiting improvement of current medical problems.  Anticipated discharge in approximately 1-2 day(s).   The patient does have a current PCP Leamon Arnt, MD) and does not need an [facility] hospital follow-up appointment after discharge.  The patient does not know have transportation limitations that hinder transportation to clinic appointments.  .Services Needed at time of discharge: Y = Yes, Blank = No PT:   OT:   RN:   Equipment:   Other:     LOS: 1 day   [name], DO 10/09/2013, 10:24 AM

## 2013-10-10 ENCOUNTER — Encounter (HOSPITAL_COMMUNITY)
Admission: EM | Disposition: A | Discharge status: Home Health | Payer: Self-pay | Source: Home / Self Care | Attending: Internal Medicine

## 2013-10-10 ENCOUNTER — Encounter (HOSPITAL_COMMUNITY): Payer: Self-pay

## 2013-10-10 DIAGNOSIS — K5520 Angiodysplasia of colon without hemorrhage: Secondary | ICD-10-CM

## 2013-10-10 HISTORY — PX: COLONOSCOPY: SHX5424

## 2013-10-10 LAB — COMPREHENSIVE METABOLIC PANEL
ALT: 9 U/L (ref 0–53)
AST: 18 U/L (ref 0–37)
Albumin: 2.7 g/dL — ABNORMAL LOW (ref 3.5–5.2)
Alkaline Phosphatase: 63 U/L (ref 39–117)
BUN: 17 mg/dL (ref 6–23)
CALCIUM: 7.9 mg/dL — AB (ref 8.4–10.5)
CO2: 22 meq/L (ref 19–32)
CREATININE: 1.2 mg/dL (ref 0.50–1.35)
Chloride: 108 mEq/L (ref 96–112)
GFR calc Af Amer: 64 mL/min — ABNORMAL LOW (ref >90)
GFR, EST NON AFRICAN AMERICAN: 55 mL/min — AB (ref >90)
Glucose, Bld: 95 mg/dL (ref 70–99)
Potassium: 3.7 mEq/L (ref 3.7–5.3)
Sodium: 144 mEq/L (ref 137–147)
Total Bilirubin: 1.7 mg/dL — ABNORMAL HIGH (ref 0.3–1.2)
Total Protein: 5.7 g/dL — ABNORMAL LOW (ref 6.0–8.3)

## 2013-10-10 LAB — CBC
HCT: 36.1 % — ABNORMAL LOW (ref 39.0–52.0)
HCT: 34.7 % — ABNORMAL LOW (ref 39.0–52.0)
HEMOGLOBIN: 10.8 g/dL — AB (ref 13.0–17.0)
Hemoglobin: 11.3 g/dL — ABNORMAL LOW (ref 13.0–17.0)
MCH: 29.3 pg (ref 26.0–34.0)
MCH: 29.2 pg (ref 26.0–34.0)
MCHC: 31.3 g/dL (ref 30.0–36.0)
MCHC: 31.1 g/dL (ref 30.0–36.0)
MCV: 93.5 fL (ref 78.0–100.0)
MCV: 93.8 fL (ref 78.0–100.0)
PLATELETS: 242 10*3/uL (ref 150–400)
Platelets: 256 10*3/uL (ref 150–400)
RBC: 3.86 MIL/uL — AB (ref 4.22–5.81)
RBC: 3.7 MIL/uL — ABNORMAL LOW (ref 4.22–5.81)
RDW: 16.1 % — AB (ref 11.5–15.5)
RDW: 15.9 % — AB (ref 11.5–15.5)
WBC: 13 10*3/uL — AB (ref 4.0–10.5)
WBC: 13.3 10*3/uL — ABNORMAL HIGH (ref 4.0–10.5)

## 2013-10-10 LAB — MAGNESIUM: Magnesium: 1.9 mg/dL (ref 1.5–2.5)

## 2013-10-10 SURGERY — COLONOSCOPY
Anesthesia: Moderate Sedation

## 2013-10-10 MED ORDER — MIDAZOLAM HCL 5 MG/ML IJ SOLN
INTRAMUSCULAR | Status: AC
  Filled 2013-10-10: qty 2

## 2013-10-10 MED ORDER — MIDAZOLAM HCL 5 MG/5ML IJ SOLN
INTRAMUSCULAR | Status: DC | PRN
  Administered 2013-10-10: 1 mg via INTRAVENOUS
  Administered 2013-10-10: 2 mg via INTRAVENOUS

## 2013-10-10 MED ORDER — FENTANYL CITRATE 0.05 MG/ML IJ SOLN
INTRAMUSCULAR | Status: DC | PRN
  Administered 2013-10-10: 25 ug via INTRAVENOUS

## 2013-10-10 MED ORDER — EPINEPHRINE HCL 1 MG/ML IJ SOLN
INTRAMUSCULAR | Status: DC | PRN
  Administered 2013-10-10: 6.5 mg via SUBCUTANEOUS

## 2013-10-10 MED ORDER — EPINEPHRINE HCL 0.1 MG/ML IJ SOSY
PREFILLED_SYRINGE | INTRAMUSCULAR | Status: AC
  Filled 2013-10-10: qty 10

## 2013-10-10 MED ORDER — BIOTENE DRY MOUTH MT LIQD
15.0000 mL | Freq: Two times a day (BID) | OROMUCOSAL | Status: DC
  Administered 2013-10-10 – 2013-10-11 (×2): 15 mL via OROMUCOSAL

## 2013-10-10 MED ORDER — FENTANYL CITRATE 0.05 MG/ML IJ SOLN
INTRAMUSCULAR | Status: AC
  Filled 2013-10-10: qty 2

## 2013-10-10 NOTE — Progress Notes (Signed)
Subjective: Wesley Wang. Pt tolerated bowl prep and then colonoscopy this AM without complications. Pt feeling well sitting up tolerating clears without nausea vomiting or diarrhea. Pt passing flatus. Ambulating in the room feels hungry. No gross blood noted in stool.    Objective: Vital signs in last 24 hours: Filed Vitals:   10/09/13 1151 10/09/13 1611 10/09/13 2245 10/09/13 2359  BP: 114/71 105/79 128/83 110/60  Pulse: 77 79 89 79  Temp: 97.7 F (36.5 C) 98.1 F (36.7 C)  98.6 F (37 C)  TempSrc: Oral Oral  Oral  Resp: 23 15 23 23   Height:      Weight:      SpO2: 95% 93% 91% 94%   Weight change:   Intake/Output Summary (Last 24 hours) at 10/10/13 0718 Last data filed at 10/09/13 1900  Gross per 24 hour  Intake   1775 ml  Output   2000 ml  Net   -225 ml   General: sitting in chair NAD HEENT: no gross abnormality, mucous membranes moist Cardiac: RRR, no rubs, murmurs or gallops Pulm: mild crackles at bases  Abd: soft, nontender, BS present, + distention Ext: warm and well perfused, no pedal edema Neuro: alert and oriented X3, able to move extremities voluntarily, responding appropriately  Lab Results: Basic Metabolic Panel:  Recent Labs Lab 10/09/13 0054 10/10/13 0323  NA 140 144  K 4.5 3.7  CL 109 108  CO2 19 22  GLUCOSE 105* 95  BUN 21 17  CREATININE 1.22 1.20  CALCIUM 7.7* 7.9*  MG  --  1.9   Liver Function Tests:  Recent Labs Lab 10/09/13 0054 10/10/13 0323  AST 14 18  ALT 7 9  ALKPHOS 65 63  BILITOT 3.1* 1.7*  PROT 5.9* 5.7*  ALBUMIN 2.7* 2.7*   CBC:  Recent Labs Lab 10/08/13 0900  10/09/13 1110 10/09/13 2025  WBC 14.6*  < > 12.1* 15.1*  NEUTROABS 13.0*  --   --   --   HGB 8.0*  < > 9.8* 10.8*  HCT 26.3*  < > 30.7* 33.7*  MCV 94.3  < > 93.0 92.3  PLT 235  < > 219 243  < > = values in this interval not displayed. Cardiac Enzymes:  Recent Labs Lab 10/08/13 2230 10/09/13 0109  TROPONINI <0.30 <0.30   CBG:  Recent Labs Lab  10/08/13 1959  GLUCAP 95   Coagulation:  Recent Labs Lab 10/09/13 0054  LABPROT 18.7*  INR 1.61*    Studies/Results: Dg Chest 2 View  10/09/2013   CLINICAL DATA:  Shortness of breath.  EXAM: CHEST  2 VIEW  COMPARISON:  10/08/2013 and 06/18/2010  FINDINGS: Right upper lobe infiltrate has increased and there is a new infiltrate in the right lower lobe. Heart size and pulmonary vascularity are within normal limits. No effusions. No acute osseous abnormality.  On the lateral view there is a 19 mm nodule overlying the lower thoracic spine. This is new since 06/18/2010.  IMPRESSION: 1. Progressive infiltrates in the right upper and lower lobes. 2. New pulmonary nodule seen posteriorly on the lateral view. It is not identified on the PA view. I recommend a chest CT scan with contrast for further evaluation   Electronically Signed   By: [name] M.D.   On: 10/09/2013 08:37   Dg Chest Portable 1 View  10/08/2013   CLINICAL DATA:  Shortness of breath.  EXAM: PORTABLE CHEST - 1 VIEW  COMPARISON:  06/18/2010 and report of chest x-ray dated  11/28/2011  FINDINGS: There is an area of consolidative infiltrate in the right upper lobe. Heart size and pulmonary vascularity are within normal limits considering the AP portable technique.  There is a calcified granuloma in the left midzone.  No effusions. No acute osseous abnormality. Old fracture of the left clavicle. Chronic rotator cuff tear in the right shoulder.  IMPRESSION: Infiltrate in the right upper lobe. I recommend follow-up chest x-ray to ensure clearing and to exclude an underlying mass.   Electronically Signed   By: [name] M.D.   On: 10/08/2013 10:28   Medications: I have reviewed the patient's current medications. Scheduled Meds: . amiodarone  200 mg Oral QPM  . atorvastatin  20 mg Oral q1800  . azithromycin  500 mg Intravenous Q24H  . cefTRIAXone (ROCEPHIN)  IV  1 g Intravenous Q24H  . citalopram  20 mg Oral QPM  . levothyroxine  50 mcg  Oral QAC breakfast  . pantoprazole  40 mg Oral Q0600  . sodium chloride  1,000 mL Intravenous Once  . sodium chloride  3 mL Intravenous Q12H   Continuous Infusions: None          PRN Meds:.acetaminophen, acetaminophen, morphine injection  Assessment/Plan: 80 year old male resident of an assisted living facility with a PMH of CAD, Atrial fibrillation on Xarelto, CKD, benign essential tremor and hx of Hodgkin's lymphoma presenting with ~ 3 week history of DOE and dark stools.   Acute blood loss anemia 2/2 UGIB: Melena is c/w upper GI etiology but EGD on 10/09/13 no source of bleeding. Colonoscopy on 10/10/13 showed several angiodysplastic lesions s/p APC and sessile polyp that was removed.  Likely precipitated by Xarelto use.  Hgb 8.0-->9.3 s/p 2 unit PRBCs since admission>>10.9 (likely concentrated 2/2 bowel prep).   EGD this AM revealed a non-obstructing Schatzki ring at the GE junctions; 3cm hiatal hernia, normal esophagus, stomach and duodenum (bulb and second portion); no upper GI source identified.  Patient reports colonoscopy at [facility] 3-5 years ago and thinks there may have been a polyp removed but no other abnormality.  Some crackles on exam and mild abdominal distention - likely 2/2 to volume overload from IVF/PRBCs/IV abx. - trend CBC today - continue PPI - transfuse PRBCs if Hgb < 8.0 given CAD history  - GI following and recommendations appreciated  Hypovolemic shock, resolved:  2/2 to blood loss; improved with IV fluids and 2 units PRBCs  - d/c continue IVF may need to resume - transfuse PRBCs prn as above  - monitoring vitals  Pneumonia: RUL and RLL infiltrates on 2-view; asymptomatic-denies cough, increased mucous, afebrile; + leukocytosis (14.6 -->11.2).  - supplemental oxygen prn  - continue ceftriaxone and azithromax will consider oral Abx on 10/11/13 - monitor WBC and for fever   36mm pulmonary nodule - seen posteriorly on lateral CXR view.  CT with contrast recommended  for further evaluation. - CT once acute issues resolved  CAD: No chest pain currently.  EKG ordered but not done.  CE negative x 3. - monitor on telemetry  - EKG  - continue simvastatin  - hold ASA in the setting of UGIB  - hold Coreg and Imdur in the setting of hypotension   Atrial fibrillation: Rate controlled 70-80s.  - monitor on telemetry  - EKG - continue amiodarone  - hold Xarelto in the setting of UGIB and if possible for 7 days per GI recs - hold Coreg in the setting of hypotension   AKI on  CKD3: Baseline Cr 1.3. Admission Cr 1.32-->1.4 Close to baseline, slight increase likely 2/2 to hypovolemia 2/2 to blood loss.  Improved to 1.22 today with fluid/PRBC resuscitation. - continue IV fluids - monitor renal function   Hypothyroidism: continue Synthroid   VTE ppx: SCDs, no pharmacologic VTE ppx given GIB.  Diet: Clear liquids now,ADAT Code: Full  Dispo: Disposition is deferred at this time, awaiting improvement of current medical problems.  Anticipated discharge in approximately 1-2 day(s).   The patient does have a current PCP Leamon Arnt, MD) and does not need an [facility] hospital follow-up appointment after discharge.  The patient does not know have transportation limitations that hinder transportation to clinic appointments.  .Services Needed at time of discharge: Y = Yes, Blank = No PT:   OT:   RN:   Equipment:   Other:     LOS: 2 days   Clinton Gallant, MD 10/10/2013, 7:18 AM

## 2013-10-10 NOTE — Progress Notes (Signed)
Patient seen and examined.  Feels well currently. No CP, no sob, no abd pain. S/p colonoscopy this AM  Cardiovascular- Normal heart sounds, regular rate and rhythm Pulm- bibasilar crackles + Ext- No pedal edema Abd- soft, non tender, ND, BS +   Assessment/plan: I have reviewed Dr. Gwenlyn Perking not and agree with documentation as outlined with the following additions: - Colonoscopy with 3 angiodysplastic lesions that were cauterized and 1 which was injected with epinephrine - f/u path from sessile polyp removal - Pt was mildly hypotensive his AM. Now improved. Will monitor - hold xarelto for 7 days and then resume - change to PO abx in AM- levaquin to complete 7 day course - CR at baseline. Will monitor

## 2013-10-10 NOTE — Op Note (Signed)
[facility] [place], [address]   COLONOSCOPY PROCEDURE REPORT  PATIENT: Wesley Wang, Wesley Wang  MR#: [id number] BIRTHDATE: 12-15-1931 , 82  yrs. old GENDER: Male ENDOSCOPIST: [name], MD REFERRED XB:JYNWG Hospitalist PROCEDURE DATE:  10/10/2013 PROCEDURE:   Colonoscopy with tissue ablation, Colonoscopy with cold biopsy polypectomy, and Submucosal injection, any substance First Screening Colonoscopy - Avg.  risk and is 50 yrs.  old or older - No.  Prior Negative Screening - Now for repeat screening. N/A  History of Adenoma - Now for follow-up colonoscopy & has been > or = to 3 yrs.  N/A  Polyps Removed Today? Yes. ASA CLASS:   Class III INDICATIONS:melena and Anemia, non-specific. MEDICATIONS: These medications were titrated to patient response per physician's verbal order, Fentanyl 25 mcg IV, Versed 3 mg IV, and Epinephrine 5 cc  DESCRIPTION OF PROCEDURE:   After the risks benefits and alternatives of the procedure were thoroughly explained, informed consent was obtained.  A digital rectal exam revealed no rectal mass.   The Pentax Ped Colon [id number]  endoscope was introduced through the anus and advanced to the cecum, which was identified by both the appendix and ileocecal valve. No adverse events experienced.   The quality of the prep was good.  The instrument was then slowly withdrawn as the colon was fully examined.   COLON FINDINGS: A sessile polyp measuring 5 mm in size was found in the ascending colon.  A polypectomy was performed with cold forceps.  The resection was complete and the polyp tissue was completely retrieved.   3 small angiodysplastic lesions were found at the cecum and ileocecal valve. These lesions were successfully ablated with APC (right colon setting).  1 large angiodysplastic lesion was found near the first valve across from the IC valve.  A 1:10,000 Epinephrine solution injection, 5 cc, was given  around this lesion to control bleeding before APC was applied. Destruction of lesion via ablation was successful using Argon plasma coagulation.  Care was given to ensure that the lumen was suctioned well.   Mild diverticulosis was noted in the sigmoid colon.  Retroflexed views revealed no abnormalities.       The scope was withdrawn and the procedure completed.  COMPLICATIONS: There were no complications.  ENDOSCOPIC IMPRESSION: 1.   Sessile polyp measuring 5 mm in size was found in the ascending colon; polypectomy was performed with cold forceps 2.  3 small angiodysplastic lesions, at the cecum, and ileocecal valve; treated with APC.  1 large angiodysplasia across from the IC valve, injection with 1:10,000 Epinephrine; treated with APC 3.   Mild diverticulosis was noted in the sigmoid colon  RECOMMENDATIONS: 1.  Await pathology results 2.  Avoid anticoagulation for 7 addition days (if possible). 3.  Monitor Hgb to ensure improvement.  Iron supplementation as needed   eSigned:  [name], MD 10/10/2013 9:12 AM   cc: The Patient   PATIENT NAME:  Wesley Wang, Wesley Wang MR#: [id number]

## 2013-10-11 DIAGNOSIS — R0902 Hypoxemia: Secondary | ICD-10-CM

## 2013-10-11 LAB — CBC
HCT: 31.8 % — ABNORMAL LOW (ref 39.0–52.0)
HEMOGLOBIN: 10 g/dL — AB (ref 13.0–17.0)
MCH: 29.6 pg (ref 26.0–34.0)
MCHC: 31.4 g/dL (ref 30.0–36.0)
MCV: 94.1 fL (ref 78.0–100.0)
PLATELETS: 224 10*3/uL (ref 150–400)
RBC: 3.38 MIL/uL — AB (ref 4.22–5.81)
RDW: 16.2 % — ABNORMAL HIGH (ref 11.5–15.5)
WBC: 12 10*3/uL — AB (ref 4.0–10.5)

## 2013-10-11 MED ORDER — ENSURE COMPLETE PO LIQD
237.0000 mL | Freq: Two times a day (BID) | ORAL | Status: DC
  Administered 2013-10-11 – 2013-10-12 (×3): 237 mL via ORAL

## 2013-10-11 MED ORDER — LEVOFLOXACIN 500 MG PO TABS
500.0000 mg | ORAL_TABLET | Freq: Every day | ORAL | Status: DC
  Administered 2013-10-12: 500 mg via ORAL
  Filled 2013-10-11: qty 1

## 2013-10-11 NOTE — Progress Notes (Signed)
Pt seen and examined. Feels well. No CP, no SOB, no abd pain.   Pulm- CTA b/l Cardiovascular- normal heart sounds, regular rate and rhythm Abd- soft, non tender, non distended, BS + Ext- No pedal edema  Assessment and Plan: I have reviewed Dr. Gwenlyn Perking note and agree with recommendations as outlined with the following additions: - Monitor CBC today. If stable ok for d/c in AM - D/c Iv antibiotics today and start PO levaquin to complete 7 day course - GI recommendations appreciated. Will hold xarelto for 1 week - Outpatient follow up with GI and PCP - Patient with pulmonary nodule. Will need CT with contrast for further eval. Can be done as outpatient

## 2013-10-11 NOTE — Progress Notes (Signed)
          Daily Rounding Note  10/11/2013, 8:22 AM  LOS: 3 days   SUBJECTIVE:       No further passing of blood, no stool  OBJECTIVE:         Vital signs in last 24 hours:    Temp:  [97.4 F (36.3 C)-100.4 F (38 C)] 100.2 F (37.9 C) (05/10 0447) Pulse Rate:  [68-105] 94 (05/10 0447) Resp:  [13-39] 21 (05/10 0200) BP: (88-137)/(36-85) 100/63 mmHg (05/10 0447) SpO2:  [66 %-100 %] 93 % (05/10 0447) Last BM Date: 10/10/13 General: looks well, alert, comfortable   Heart: RRR Chest: clear bil Abdomen: soft, NT, ND.  Active BS  Extremities: no CCE Neuro/Psych:  Pleasant, slightly confused but oriented to self, place. Moves al 4s.    Intake/Output from previous day: 05/09 0701 - 05/10 0700 In: 400 [P.O.:350; IV Piggyback:50] Out: 851 [Urine:850; Stool:1]  Intake/Output this shift:    Lab Results:  Recent Labs  10/10/13 0750 10/10/13 1830 10/11/13 0350  WBC 13.0* 13.3* 12.0*  HGB 11.3* 10.8* 10.0*  HCT 36.1* 34.7* 31.8*  PLT 242 256 224   BMET  Recent Labs  10/08/13 0900 10/08/13 0919 10/09/13 0054 10/10/13 0323  NA 141 141 140 144  K 4.6 4.4 4.5 3.7  CL 107 107 109 108  CO2 19  --  19 22  GLUCOSE 163* 161* 105* 95  BUN 22 20 21 17   CREATININE 1.32 1.40* 1.22 1.20  CALCIUM 8.0*  --  7.7* 7.9*   LFT  Recent Labs  10/08/13 0900 10/09/13 0054 10/10/13 0323  PROT 6.2 5.9* 5.7*  ALBUMIN 2.9* 2.7* 2.7*  AST 14 14 18   ALT 8 7 9   ALKPHOS 71 65 63  BILITOT 2.0* 3.1* 1.7*   PT/INR  Recent Labs  10/09/13 0054  LABPROT 18.7*  INR 1.61*    ASSESMENT:   *  Melena.  ABL anemia s/p one unit PRBC.  Hgb nadir 8.0 EGD 5/8: Schatzki ring, HH.  Colonoscopy 5/9: ascending sessile polyp.  AVMs at cecum and IC valve, obliterated with APC, one was epi injected.  Diverticulosis.  *  Xarelto for A fib, previously on Coumadin.  *  Right upper lobe infiltrate, on Rocephin   PLAN   *  restart AC on 5/16.   Follow Hgb.  *  No need of GI follow up. Will sign off.  *  Would keep on prophylactic once daily PPI given higher risk if he developes an ulcer for GI bleeding in setting of AC.     Vena Rua  10/11/2013, 8:22 AM Pager: [phone]

## 2013-10-11 NOTE — Progress Notes (Signed)
Patient seen, examined, and I agree with the above documentation, including the assessment and plan. Status post APC ablation of angiodysplasias of the right colon Stable hemoglobin, diet has been restarted and is being tolerated Would hold anticoagulation for another 5-7 days if possible He has followup with PCP on 10/20/2013 We'll check hemoglobin in our office on the 15th Okay for discharge from GI perspective Call with questions

## 2013-10-11 NOTE — Progress Notes (Signed)
Subjective: Wesley Wang. One episode of hypoxia and responded to increased O2 (3L this AM). Pt doesn't feel sob, doe, tolerated full diet, reports 1 bm overnight no blood, and ?flatus (nurse reports that is truly happening and pt not sure but pt has MCI).    Objective: Vital signs in last 24 hours: Filed Vitals:   10/11/13 0000 10/11/13 0001 10/11/13 0200 10/11/13 0447  BP: 109/61 109/61 104/78 100/63  Pulse: 84 86 75 94  Temp:  100.4 F (38 C)  100.2 F (37.9 C)  TempSrc:  Oral  Oral  Resp: 25 24 21    Height:      Weight:      SpO2: 93% 94% 89% 93%   Weight change:   Intake/Output Summary (Last 24 hours) at 10/11/13 0826 Last data filed at 10/11/13 0600  Gross per 24 hour  Intake    400 ml  Output    850 ml  Net   -450 ml   General: laying in bed, just completed 100% meal, comfortable wanting to nap HEENT: no gross abnormality, mucous membranes moist Cardiac: RRR, no rubs, murmurs or gallops Pulm: mild crackles at bases, no wheezes or crackles, decreased O2 to 2L and pt maintained sats in 90s Abd: soft, nontender, BS present, + distention Ext: warm and well perfused, no pedal edema Neuro: alert and oriented X3, able to move extremities voluntarily, responding appropriately  Lab Results: Basic Metabolic Panel:  Recent Labs Lab 10/09/13 0054 10/10/13 0323  NA 140 144  K 4.5 3.7  CL 109 108  CO2 19 22  GLUCOSE 105* 95  BUN 21 17  CREATININE 1.22 1.20  CALCIUM 7.7* 7.9*  MG  --  1.9   Liver Function Tests:  Recent Labs Lab 10/09/13 0054 10/10/13 0323  AST 14 18  ALT 7 9  ALKPHOS 65 63  BILITOT 3.1* 1.7*  PROT 5.9* 5.7*  ALBUMIN 2.7* 2.7*   CBC:  Recent Labs Lab 10/08/13 0900  10/10/13 1830 10/11/13 0350  WBC 14.6*  < > 13.3* 12.0*  NEUTROABS 13.0*  --   --   --   HGB 8.0*  < > 10.8* 10.0*  HCT 26.3*  < > 34.7* 31.8*  MCV 94.3  < > 93.8 94.1  PLT 235  < > 256 224  < > = values in this interval not displayed. Cardiac Enzymes:  Recent Labs Lab  10/08/13 2230 10/09/13 0109  TROPONINI <0.30 <0.30   CBG:  Recent Labs Lab 10/08/13 1959  GLUCAP 95   Coagulation:  Recent Labs Lab 10/09/13 0054  LABPROT 18.7*  INR 1.61*    Studies/Results: No results found. Medications: I have reviewed the patient's current medications. Scheduled Meds: . amiodarone  200 mg Oral QPM  . antiseptic oral rinse  15 mL Mouth Rinse BID  . atorvastatin  20 mg Oral q1800  . azithromycin  500 mg Intravenous Q24H  . cefTRIAXone (ROCEPHIN)  IV  1 g Intravenous Q24H  . citalopram  20 mg Oral QPM  . levothyroxine  50 mcg Oral QAC breakfast  . pantoprazole  40 mg Oral Q0600  . sodium chloride  1,000 mL Intravenous Once  . sodium chloride  3 mL Intravenous Q12H   Continuous Infusions: None          PRN Meds:.acetaminophen, acetaminophen, morphine injection  Assessment/Plan: 80 year old male resident of an assisted living facility with a PMH of CAD, Atrial fibrillation on Xarelto, CKD, benign essential tremor and hx of Hodgkin's  lymphoma presenting with ~ 3 week history of DOE and dark stools.   Acute hypoxia- this is in the setting of colonoscopy therefore maybe atelectasis vs PNA. All other signs of infection improving in terms of PNA therefore seems less likely. Pt abd distended maybe impeding good deep breathing.  -CXR -continuous pulse ox -cont supplemental O2, encourage ambulation, flutter valve  Acute blood loss anemia 2/2 UGIB-resolved : Melena is c/w upper GI etiology but EGD on 10/09/13 no source of bleeding. Colonoscopy on 10/10/13 showed several angiodysplastic lesions s/p APC and sessile polyp that was removed.  Likely precipitated by Xarelto use.  Hgb 8.0-->9.3 s/p 2 unit PRBCs since admission>>10.9 (likely concentrated 2/2 bowel prep).   EGD this AM revealed a non-obstructing Schatzki ring at the GE junctions; 3cm hiatal hernia, normal esophagus, stomach and duodenum (bulb and second portion); no upper GI source identified.  Patient  reports colonoscopy at [facility]-5 years ago and thinks there may have been a polyp removed but no other abnormality.  Some crackles on exam and mild abdominal distention - likely 2/2 to volume overload from IVF/PRBCs/IV abx. - trend CBC today - continue PPI - transfuse PRBCs if Hgb < 8.0 given CAD history  - GI following and recommendations appreciated -can transfer to medsurg   Pneumonia: RUL and RLL infiltrates on 2-view; asymptomatic-denies cough, increased mucous, afebrile; + leukocytosis (14.6 -->11.2).  - supplemental oxygen prn  - continue ceftriaxone and azithromax >> oral Abx on 10/11/13 of levaquin 500mg  to complete 7 day treatment  - monitor WBC and for fever   1mm pulmonary nodule - seen posteriorly on lateral CXR view.  CT with contrast recommended for further evaluation. - CT once acute issues resolved  CAD: No chest pain currently.  EKG ordered but not done.  CE negative x 3. - monitor on telemetry  - EKG  - continue simvastatin  - hold ASA in the setting of UGIB  - hold Coreg and Imdur in the setting of hypotension   Atrial fibrillation: Rate controlled 70-80s.  - monitor on telemetry  - EKG - continue amiodarone  - hold Xarelto in the setting of UGIB and if possible for 7 days per GI recs - hold Coreg in the setting of hypotension   AKI on CKD3: Baseline Cr 1.3. Admission Cr 1.32-->1.4 Close to baseline, slight increase likely 2/2 to hypovolemia 2/2 to blood loss.  Improved to 1.22 today with fluid/PRBC resuscitation. - continue IV fluids - monitor renal function   Hypothyroidism: continue Synthroid   VTE ppx: SCDs, no pharmacologic VTE ppx given GIB.  Diet: Clear liquids now,ADAT Code: Full  Dispo: Disposition is deferred at this time, awaiting improvement of current medical problems.  Anticipated discharge in approximately 1-2 day(s).   The patient does have a current PCP Leamon Arnt, MD) and does not need an [facility] hospital follow-up appointment after  discharge.  The patient does not know have transportation limitations that hinder transportation to clinic appointments.  .Services Needed at time of discharge: Y = Yes, Blank = No PT:   OT:   RN:   Equipment:   Other:     LOS: 3 days   Clinton Gallant, MD 10/11/2013, 8:26 AM

## 2013-10-12 ENCOUNTER — Inpatient Hospital Stay (HOSPITAL_COMMUNITY): Payer: Medicare Other

## 2013-10-12 ENCOUNTER — Encounter (HOSPITAL_COMMUNITY): Payer: Self-pay | Admitting: Internal Medicine

## 2013-10-12 ENCOUNTER — Other Ambulatory Visit: Payer: Self-pay | Admitting: Gastroenterology

## 2013-10-12 DIAGNOSIS — D649 Anemia, unspecified: Secondary | ICD-10-CM

## 2013-10-12 LAB — CBC
HCT: 32.9 % — ABNORMAL LOW (ref 39.0–52.0)
Hemoglobin: 10.4 g/dL — ABNORMAL LOW (ref 13.0–17.0)
MCH: 29.5 pg (ref 26.0–34.0)
MCHC: 31.6 g/dL (ref 30.0–36.0)
MCV: 93.2 fL (ref 78.0–100.0)
Platelets: 250 10*3/uL (ref 150–400)
RBC: 3.53 MIL/uL — ABNORMAL LOW (ref 4.22–5.81)
RDW: 16.3 % — AB (ref 11.5–15.5)
WBC: 11.9 10*3/uL — ABNORMAL HIGH (ref 4.0–10.5)

## 2013-10-12 LAB — TYPE AND SCREEN
ABO/RH(D): O NEG
ANTIBODY SCREEN: NEGATIVE
UNIT DIVISION: 0
Unit division: 0
Unit division: 0

## 2013-10-12 MED ORDER — LEVOFLOXACIN 500 MG PO TABS: 500.0000 mg | ORAL_TABLET | Freq: Every day | ORAL | Status: AC

## 2013-10-12 MED ORDER — SIMETHICONE 80 MG PO CHEW
80.0000 mg | CHEWABLE_TABLET | Freq: Four times a day (QID) | ORAL | Status: DC
  Administered 2013-10-12 (×2): 80 mg via ORAL
  Filled 2013-10-12 (×4): qty 1

## 2013-10-12 MED ORDER — PANTOPRAZOLE SODIUM 40 MG PO TBEC: 40.0000 mg | DELAYED_RELEASE_TABLET | Freq: Every day | ORAL | Status: AC

## 2013-10-12 NOTE — Progress Notes (Addendum)
Subjective: No acute events overnight.  Patient seen and examined this AM and had c/o diffuse abdominal pain and subjective fever.  Vitals were stable, he was afebrile.  He denied CP, dyspnea, problems urinating.  Last BM was yesterday.    Objective: Vital signs in last 24 hours: Filed Vitals:   10/11/13 1900 10/11/13 2231 10/12/13 0300 10/12/13 0823  BP:  118/76 96/75 95/65   Pulse:  93 84 87  Temp:  97.9 F (36.6 C) 98 F (36.7 C) 98.6 F (37 C)  TempSrc:  Oral Oral Oral  Resp:  20 18 21   Height:      Weight:      SpO2: 91% 92% 92% 95%   Weight change:   Intake/Output Summary (Last 24 hours) at 10/12/13 1202 Last data filed at 10/12/13 0600  Gross per 24 hour  Intake    240 ml  Output    550 ml  Net   -310 ml   General: laying in bed in NAD HEENT: no gross abnormality, mucous membranes moist Cardiac: RRR, no rubs, murmurs or gallops Pulm: CTA B/L, SpO2 upper 90s on RA Abd: soft, BS present, ND, diffusely TTP, no guarding or rebound Ext: warm and well perfused, no pedal edema Neuro: alert and oriented X3, able to move extremities voluntarily, responding appropriately  Lab Results:  CBC:  Recent Labs Lab 10/08/13 0900  10/11/13 0350 10/12/13 0654  WBC 14.6*  < > 12.0* 11.9*  NEUTROABS 13.0*  --   --   --   HGB 8.0*  < > 10.0* 10.4*  HCT 26.3*  < > 31.8* 32.9*  MCV 94.3  < > 94.1 93.2  PLT 235  < > 224 250  < > = values in this interval not displayed.  Studies/Results: No results found. Medications: I have reviewed the patient's current medications. Scheduled Meds: . amiodarone  200 mg Oral QPM  . antiseptic oral rinse  15 mL Mouth Rinse BID  . atorvastatin  20 mg Oral q1800  . citalopram  20 mg Oral QPM  . feeding supplement (ENSURE COMPLETE)  237 mL Oral BID BM  . levofloxacin  500 mg Oral Daily  . levothyroxine  50 mcg Oral QAC breakfast  . pantoprazole  40 mg Oral Q0600  . simethicone  80 mg Oral QID  . sodium chloride  3 mL Intravenous Q12H    Continuous Infusions: None          PRN Meds:.acetaminophen, acetaminophen  Assessment/Plan: 80 year old male resident of an assisted living facility with a PMH of CAD, Atrial fibrillation on Xarelto, CKD, benign essential tremor and hx of Hodgkin's lymphoma presenting with ~ 3 week history of DOE and dark stools.   Acute hypoxia, resolved- this is in the setting of colonoscopy therefore maybe atelectasis vs PNA.  All other signs of infection improving in terms of PNA therefore seems less likely. Patient had some abdominal distention may have impeded deep breathing.  SpO2 mid 90s-100 on room air today.  Patient noted to be dyspneic upon ambulating with PT, by the time pulse ox was obtained he was SpO2 100%.  The patient and his son report he is at baseline.   - He will need follow-up with PCP - scheduled hospital follow-up on 10/09/2013  Abdominal pain, resolved - diffuse abdominal pain w/o peritoneal signs.  + BS, soft.  The patient denied N/V.  Last BM yesterday, no diarrhea, afebrile.  When the team saw the patient later in the  morning he was sitting up in chair, had just finished ambulating with PT and denied abdominal pain.  The original discomfort was likely 2/2 to gas.    Acute blood loss anemia 2/2 UGIB-resolved : Melena is c/w upper GI etiology but EGD on 10/09/13 no source of bleeding. Colonoscopy on 10/10/13 showed several angiodysplastic lesions s/p APC and sessile polyp that was removed.  Likely precipitated by Xarelto use.  Hgb 8.0-->9.3 s/p 2 unit PRBCs since admission>>10.4.  Clear lungs on exam today. - continue PPI at discharge - continue to hold Xarelto and ASA until 10/17/2013  Pneumonia: RUL and RLL infiltrates on 2-view; asymptomatic-denies cough, increased mucous, afebrile; + leukocytosis (14.6 -->11.9).  - d/c with two more days of Levaquin (7 day total antibiotic for CAP)  51mm pulmonary nodule - seen posteriorly on lateral CXR view.  CT with contrast recommended for  further evaluation. - Pulmonology follow-up scheduled for 10/28/13  CAD: No chest pain currently.  EKG ordered but not done.  CE negative x 3. - monitor on telemetry  - EKG  - continue simvastatin  - hold ASA in the setting of UGIB; patient's son says this was recently stopped by PCP  - hold Coreg and Imdur in the setting of low-normal BP; will defer to PCP to restart at hospital follow-up  Atrial fibrillation: Rate controlled 70-80s.  - continue amiodarone at d/c  - restart Xarelto on 10/17/2013 - hold Coreg in the setting of low-normal BP; will defer to PCP to restart at hospital follow-up  AKI on CKD3: Baseline Cr 1.3. Admission Cr 1.32-->1.4 Close to baseline, slight increase likely 2/2 to hypovolemia 2/2 to blood loss.  Improved to 1.20 with fluid/PRBC resuscitation.  Hypothyroidism: continue Synthroid   VTE ppx: SCDs, no pharmacologic VTE ppx given GIB.  Diet: Clear liquids now,ADAT Code: Full  Dispo:   PT recommended SNF.  However, patient and family would prefer if he returned to his Independent Living facility.  The patient's granddaughter is available to stay with him while he regains strength.  Home PT will be ordered at d/c.  He is stable for d/c to home with close follow-up.  The patient does have a current PCP Leamon Arnt, MD) and does not need an [facility] hospital follow-up appointment after discharge.  The patient does not know have transportation limitations that hinder transportation to clinic appointments.  .Services Needed at time of discharge: Y = Yes, Blank = No PT:  Y  OT:  Y  RN:   Equipment:  rolling walker  Other:     LOS: 4 days   [name], DO 10/12/2013, 12:02 PM

## 2013-10-12 NOTE — Progress Notes (Signed)
Reviewed Physical Therapist recommendations with MD over the phone. MD stated that pt's son wants pt to go home and not to skilled nursing facility. MD stated she would order the pt a walker prior to discharge but plans to discharge pt to home today.

## 2013-10-12 NOTE — Evaluation (Signed)
Physical Therapy Evaluation Patient Details Name: Wesley Wang MRN: [id number] DOB: January 21, 1932 Today's Date: 10/12/2013   History of Present Illness  Adm 10/08/13 with dizziness. +GI bleed and pna PMHx- CAD, afib on anticoagulant, benign tremor  Clinical Impression  Pt admitted with above. Pt from an [place] apartment [facility]) and does not have 24 hr assist available on discharge. He was significantly dyspneic (unable to speak) after walking 30 ft. Pulse ox reading difficult to obtain (see below). Typically does not need a device, however was unsteady and with antalgic gait due to back pain and required education in use of RW for gait. Pt currently with functional limitations due to the deficits listed below (see PT Problem List). Currently recommend pt have 24 hr assist, however with further education on use of RW and OT Consult for ADL training/energy conservation techniques, feel pt could return to [place]. Pt will benefit from skilled PT to increase their independence and safety with mobility to allow discharge to the venue listed below.       Follow Up Recommendations SNF;Supervision/Assistance - 24 hour    Equipment Recommendations  Rolling walker with 5" wheels    Recommendations for Other Services OT consult     Precautions / Restrictions Precautions Precautions: Fall Precaution Comments: has fallen in past 6-8 weeks (son unsure of exact timeframe)      Mobility  Bed Mobility Overal bed mobility: Modified Independent             General bed mobility comments: incr time and effort; use of rail  Transfers Overall transfer level: Needs assistance Equipment used: Rolling walker (2 wheeled);None Transfers: Sit to/from Stand Sit to Stand: Min guard;Supervision         General transfer comment: repeated x 5; guarding for safety due to h/o dizziness (denied currently)   Ambulation/Gait Ambulation/Gait assistance: Min guard Ambulation  Distance (Feet): 60 Feet (10 (to bathroom), 60 after) Assistive device: Rolling walker (2 wheeled);None Gait Pattern/deviations: Step-through pattern;Decreased stride length;Antalgic;Trunk flexed   Gait velocity interpretation: Below normal speed for age/gender General Gait Details: without RW, significant antalgic gait due to low back pain (excessive lateral lean to right with WB RLE); educated on use of RW and pt required continued cues to keep RW closer to his body for upright posture and support  Stairs            Wheelchair Mobility    Modified Rankin (Stroke Patients Only)       Balance Overall balance assessment: Needs assistance         Standing balance support: No upper extremity supported Standing balance-Leahy Scale: Good Standing balance comment: stood x 1 minute with fatigue and requested return to sitting to rest         Rhomberg - Eyes Opened: 30     High Level Balance Comments: asked pt if he dropped something in kitchen what he would do, he proceeded to reach to floor (slowly) with no loss of balance             Pertinent Vitals/Pain 5/10 abd pain (at rest), when walking reported back pain was greater than abd pain Significant dyspnea (unable to speak) with ambulation; SaO2 reading  73% with HR 110 (?accuracy, however manual pulse did match pulse on monitor) After seated rest x 4 minutes (time to obtain another pulse ox and forehead probe) SaO2 100% and dyspnea resolved    Home Living Family/patient expects to be discharged to:: Private residence [facility]  Independent Living) Living Arrangements: Alone Available Help at Discharge: Family;Available PRN/intermittently (no 24 hr care available at his facility) Type of Home: Apartment Home Access: Level entry     Home Layout: One level Home Equipment: Cane - single point;Shower seat - built in;Grab bars - tub/shower (handicap height toilet) Additional Comments: limited due to recent  dizziness;     Prior Function Level of Independence: Independent         Comments: tripped over curb in Dec with fall; recent fall walking in hallway (caught his toe)     Hand Dominance        Extremity/Trunk Assessment   Upper Extremity Assessment: Overall WFL for tasks assessed           Lower Extremity Assessment: Overall WFL for tasks assessed      Cervical / Trunk Assessment: Other exceptions  Communication   Communication: No difficulties  Cognition Arousal/Alertness: Awake/alert Behavior During Therapy: WFL for tasks assessed/performed Overall Cognitive Status: History of cognitive impairments - at baseline       Memory: Decreased short-term memory              General Comments General comments (skin integrity, edema, etc.): son present and assisted with h/o (he noted pt has memory problems); he reports there is no skilled level of care at pt's facility and that there are no options for 24 hour care amongst family    Exercises Other Exercises Other Exercises: Educated pt, son, and nurse tech that pt needs to walk again at least 2x today (and use forehead pulse ox probe for SaO2 measure)      Assessment/Plan    PT Assessment Patient needs continued PT services  PT Diagnosis Difficulty walking   PT Problem List Decreased activity tolerance;Decreased balance;Decreased mobility;Decreased cognition;Decreased knowledge of use of DME;Decreased safety awareness;Cardiopulmonary status limiting activity  PT Treatment Interventions DME instruction;Gait training;Functional mobility training;Therapeutic activities;Therapeutic exercise;Balance training;Cognitive remediation;Patient/family education   PT Goals (Current goals can be found in the Care Plan section) Acute Rehab PT Goals Patient Stated Goal: get his strength back PT Goal Formulation: With patient Time For Goal Achievement: 10/19/13 Potential to Achieve Goals: Good    Frequency Min 3X/week    Barriers to discharge Decreased caregiver support      Co-evaluation               End of Session Equipment Utilized During Treatment: Gait belt Activity Tolerance: Patient limited by fatigue;Patient limited by pain (chronic back pain) Patient left: in chair;with call bell/phone within reach;with family/visitor present;with nursing/sitter in room Nurse Communication: Mobility status;Other (comment) (walking, try to get SaO2 with activity)         Time: 9833-8250 PT Time Calculation (min): 33 min   Charges:   PT Evaluation $Initial PT Evaluation Tier I: 1 Procedure PT Treatments $Gait Training: 23-37 mins   PT G CodesJeanie Cooks Johnattan Strassman Nov 11, 2013, 10:12 AM Pager [phone]

## 2013-10-12 NOTE — Progress Notes (Signed)
Discharge Note. Pt receptive to discharge teaching/handout. Pt and pt's two granddaughters at the bedside for discharge instructions. Pt was given follow up appointments and reminded to pick up medications from his outpatient pharmacy. Pt reported that he feels ready for discharge. Pt did ambulate twice today prior to discharge and seems to do well with front wheel walker. Pt's home walker delivered prior to pt's discharge.

## 2013-10-12 NOTE — Discharge Summary (Signed)
Patient Name:  Wesley Wang  MRN: [id number]  PCP: Leamon Arnt, MD  DOB:  1931/09/17       Date of Admission:  10/08/2013  Date of Discharge:  10/12/2013      Attending Physician: Dr. Aldine Contes, MD         DISCHARGE DIAGNOSES: 1.   Gastrointestinal bleed 2.   Acute blood loss anemia secondary to GI bleed 3.   Hypovolemic shock secondary to GI bleed 4.   Community acquired pneumonia 5.   Acute on chronic kidney disease, stage 3 6.   Atrial fibrillation 7.   Coronary artery disease 8.   Angiodysplasia of colon 9.   Solitary pulmonary nodule   DISPOSITION AND FOLLOW-UP: [name] is to follow-up with the listed providers as detailed below, at patient's visiting, please address following issues:  1) CBC - His hemoglobin was 10.4 at discharge.  Please check that it is continuing to improve. 2) follow-up GI pathology report (sessile polyp) 3) Please ensure that he keeps his pulmonology follow-up appointment.  Follow-up Information   Follow up with ANDY,CAMILLE L, MD On 10/21/2013. (for hospital follow-up)    Specialty:  Family Medicine   Contact information:   [place] [address] [phone]       Call [name], MD. (As needed (gastroenterologist))    Specialty:  Gastroenterology   Contact information:   [address] [address] [phone]       Follow up with [name] On 10/28/2013. (regarding lung nodule)    Specialty:  Pulmonology   Contact information:   [address] [place] [address] [phone]     Discharge Orders   Future Appointments Provider Department Dept Phone   10/28/2013 9:15 AM [name], MD [facility] [phone]   Future Orders Complete By Expires   Call MD for:  difficulty breathing, headache or visual disturbances  As directed    Call MD for:  extreme fatigue  As directed    Call MD for:  persistant dizziness or light-headedness  As  directed    Call MD for:  persistant nausea and vomiting  As directed    Call MD for:  severe uncontrolled pain  As directed    Call MD for:  temperature >100.4  As directed    Diet - low sodium heart healthy  As directed    Increase activity slowly  As directed        DISCHARGE MEDICATIONS:   Medication List    STOP taking these medications       aspirin 81 MG tablet     carvedilol 12.5 MG tablet  Commonly known as:  COREG     isosorbide mononitrate 60 MG 24 hr tablet  Commonly known as:  IMDUR     Rivaroxaban 15 MG Tabs tablet  Commonly known as:  XARELTO      TAKE these medications       amiodarone 200 MG tablet  Commonly known as:  PACERONE  Take 1 tablet (200 mg total) by mouth every evening.     citalopram 20 MG tablet  Commonly known as:  CELEXA  Take 20 mg by mouth every evening.     KLOR-CON M20 20 MEQ tablet  Generic drug:  potassium chloride SA  Take 20 mEq by mouth 2 (two) times daily.     levofloxacin 500 MG tablet  Commonly known as:  LEVAQUIN  Take 1 tablet (500 mg total) by mouth daily.     levothyroxine 50 MCG tablet  Commonly known as:  SYNTHROID, LEVOTHROID  Take 50 mcg by mouth daily before breakfast.     NITROSTAT 0.4 MG SL tablet  Generic drug:  nitroGLYCERIN  Place 0.4 mg under the tongue every 5 (five) minutes as needed for chest pain.     pantoprazole 40 MG tablet  Commonly known as:  PROTONIX  Take 1 tablet (40 mg total) by mouth daily.     simvastatin 40 MG tablet  Commonly known as:  ZOCOR  Take 40 mg by mouth every evening.         CONSULTS:   Gastroenterology   PROCEDURES PERFORMED:  Dg Chest 2 View  10/09/2013   CLINICAL DATA:  Shortness of breath.  EXAM: CHEST  2 VIEW  COMPARISON:  10/08/2013 and 06/18/2010  FINDINGS: Right upper lobe infiltrate has increased and there is a new infiltrate in the right lower lobe. Heart size and pulmonary vascularity are within normal limits. No effusions. No acute osseous  abnormality.  On the lateral view there is a 19 mm nodule overlying the lower thoracic spine. This is new since 06/18/2010.  IMPRESSION: 1. Progressive infiltrates in the right upper and lower lobes. 2. New pulmonary nodule seen posteriorly on the lateral view. It is not identified on the PA view. I recommend a chest CT scan with contrast for further evaluation   Electronically Signed   By: [name] M.D.   On: 10/09/2013 08:37   Dg Chest Portable 1 View  10/08/2013   CLINICAL DATA:  Shortness of breath.  EXAM: PORTABLE CHEST - 1 VIEW  COMPARISON:  06/18/2010 and report of chest x-ray dated 11/28/2011  FINDINGS: There is an area of consolidative infiltrate in the right upper lobe. Heart size and pulmonary vascularity are within normal limits considering the AP portable technique.  There is a calcified granuloma in the left midzone.  No effusions. No acute osseous abnormality. Old fracture of the left clavicle. Chronic rotator cuff tear in the right shoulder.  IMPRESSION: Infiltrate in the right upper lobe. I recommend follow-up chest x-ray to ensure clearing and to exclude an underlying mass.   Electronically Signed   By: [name] M.D.   On: 10/08/2013 10:28     10/09/13 Endoscopy - Schatzki ring, 3cm hiatal hernia  10/10/13 Colonscopy - several AVMs (s/p APC), a small sessile poly (removed), mild diverticulosis  ADMISSION DATA: H&P: Mr. Trimpe is an 80 year old male resident of an assisted living facility with a PMH of CAD, Atrial fibrillation on Xarelto, CKD, benign essential tremor and hx of Hodgkin's lymphoma. He presents to the ED with a several week (not quite 1 month) history of DOE and dark stools. He also notes some lightheadedness. He denies N/V, change in appetite, history of PUD, prior GI bleed or upper endoscopy. He reports occasional AM chest pain lasting ~ 15 minutes and resolving, not at present, has not needed to use NTG. He denies orthopnea, or lower extremity edema. He quit  smoking over 20 years ago and denies drug or EtOH use. He reports that he last took Xarelto yesterday. He also takes a daily ASA but denies other NSAID use.  In the ED: T97.73F, RR22, SpO2 91% on RA, HR 88, BP 75/7mmHg; he was given 1L NS bolus and started on ceftriaxone and azithromycin.  Physical Exam: Blood pressure 87/58, pulse 72, temperature 98.2 F (36.8 C), temperature  source Oral, resp. rate 22, SpO2 100.00%.  General: resting in bed in NAD  HEENT: PERRL, EOMI, no scleral icterus  Cardiac: RRR, no rubs, murmurs or gallops, 2+ B/L dorsalis pedis and radial pulses  Pulm: crackles at right lung base, otherwise clear, moving normal volumes of air  Abd: soft, nontender, nondistended, BS present  Ext: warm and well perfused, no pedal edema  Neuro: alert and oriented X3, cranial nerves II-XII grossly intact, muscle strength equal and intact  Labs: Basic Metabolic Panel:   Recent Labs   10/08/13 0900  10/08/13 0919   NA  141  141   K  4.6  4.4   CL  107  107   CO2  19  --   GLUCOSE  163*  161*   BUN  22  20   CREATININE  1.32  1.40*   CALCIUM  8.0*  --    Liver Function Tests:   Recent Labs   10/08/13 0900   AST  14   ALT  8   ALKPHOS  71   BILITOT  2.0*   PROT  6.2   ALBUMIN  2.9*    CBC:   Recent Labs   10/08/13 0900  10/08/13 0919   WBC  14.6*  --   NEUTROABS  13.0*  --   HGB  8.0*  8.8*   HCT  26.3*  26.0*   MCV  94.3  --   PLT  235  --    Cardiac Enzymes:  poc Troponin negative  HOSPITAL COURSE: Acute blood loss anemia and hypovolemic shock secondary to GI bleed, resolved:  The patient's hemoglobin was 8.0 at admission (baseline ~15), with a blood pressure of 75/35mmHg and he reported melena.  Xarelto was held, he was started on a PPI and resuscitated with IV fluids and 2 units of PRBCs.  His goal hemoglobin was > 8.0 given his history of coronary artery disease.  He was stabilized and monitored closely in the step-down unit.  His symptoms were  consistent with upper GI etiology but EGD on 10/09/13 revealed no source of bleeding.  Colonoscopy on 10/10/13 showed several angiodysplastic lesions which were ablated with APC, a small sessile polyp that was removed and mild diverticulosis.  His hemoglobin remained stable during admission and he did not required additional transfusions.  Per GI recommendation, he has been instructed to continue to hold ASA and Xarelto until 10/17/13.  Protonix was continued at discharge.  He will need a hemoglobin check at his hospital follow-up appointment.  PT recommended SNF at discharge however the patient would prefer to stay in his home.  His son was present and reports that the patient's granddaughter can provide 24 hour supervision.  Home PT has been arranged.He was discharged in stable condition with hospital follow-up scheduled on 10/30/2013 with his PCP.    Community acquired pneumonia:  RUL and RLL infiltrates were evident on 2-view CXR.  The patient was asymptomatic but did have a mild leukocytosis.  He developed some hypoxia acutely which was likely attributable to pneumonia versus atelectasis after colonoscopy.  He was treated with antibiotics and discharged on two more days of Levaquin to complete a 7 day course.  He will follow-up with his PCP on 10/28/2013.  AKI on CKD3:  His baseline Cr is around 1.3. Admission Cr 1.32-->1.4 and the slight increase was likely secondary to hypovolemia in the setting of acute blood loss. His Cr improved to 1.20 with fluid/PRBC resuscitation.   Solitary  pulmonary nodule:  92mm nodule seen posteriorly on lateral CXR view. CT with contrast recommended for further evaluation. Pulmonology follow-up scheduled for 10/28/13.  CAD:  He was monitored on telemetry.  He had no chest pain or EKG changes and CE were negative.  His statin was continued but ASA, Coreg and Imdur were held in the setting of GI bleed and hypotension.  These medications were also held at discharge.  Will defer  to PCP to restart at hospital follow-up   Atrial fibrillation:  Amiodarone was continued.  Coreg was held in the setting of low-normal blood pressure.  His rate controlled was controlled during admission.  Will defer to PCP to restart at hospital follow-up   DISCHARGE DATA: Vital Signs: BP 102/55  Pulse 75  Temp(Src) 97.4 F (36.3 C) (Oral)  Resp 22  Ht 5\' 7"  (1.702 m)  Wt 82.3 kg (181 lb 7 oz)  BMI 28.41 kg/m2  SpO2 100%  Labs: Results for orders placed during the hospital encounter of 10/08/13 (from the past 24 hour(s))  CBC     Status: Abnormal   Collection Time    10/12/13  6:54 AM      Result Value Ref Range   WBC 11.9 (*) 4.0 - 10.5 K/uL   RBC 3.53 (*) 4.22 - 5.81 MIL/uL   Hemoglobin 10.4 (*) 13.0 - 17.0 g/dL   HCT 32.9 (*) 39.0 - 52.0 %   MCV 93.2  78.0 - 100.0 fL   MCH 29.5  26.0 - 34.0 pg   MCHC 31.6  30.0 - 36.0 g/dL   RDW 16.3 (*) 11.5 - 15.5 %   Platelets 250  150 - 400 K/uL     Services Ordered on Discharge: Y = Yes; Blank = No PT:   OT:   RN:   Equipment:   Other:      Time Spent on Discharge: 35 min   Signed: [name] PGY 1, Internal Medicine Resident 10/12/2013, 2:09 PM

## 2013-10-12 NOTE — ED Provider Notes (Signed)
Medical screening examination/treatment/procedure(s) were conducted as a shared visit with non-physician practitioner(s) and myself.  I personally evaluated the patient during the encounter.   EKG Interpretation   Date/Time:  Thursday Oct 08 2013 08:34:08 EDT Ventricular Rate:  68 PR Interval:  238 QRS Duration: 118 QT Interval:  480 QTC Calculation: 510 R Axis:   108 Text Interpretation:  Sinus rhythm with 1st degree A-V block Incomplete  right bundle branch block Possible Right ventricular hypertrophy Abnormal  QRS-T angle, consider primary T wave abnormality Prolonged QT Abnormal ECG  ED PHYSICIAN INTERPRETATION AVAILABLE IN [facility] Confirmed by  TEST, Record ([id number]) on 10/10/2013 1:22:30 PM      Patient with hypotension in setting of GI bleed. Stable here, has blood on rectal exam but no bowel movements. BP stabilized with fluids. Given his borderline hemoglobin and hypotension, will transfuse. Given his stabilization he is ok for stepdown at this time.   Ephraim Hamburger, MD 10/12/13 [phone]

## 2013-10-12 NOTE — Discharge Instructions (Addendum)
1. You have hospital follow up appointments as follows: With Dr. Billey Chang on Wednesday, 10/09/2013 at 3:15PM Please call Dr. Vena Rua office (Gastroenterology) to see if you need to follow-up with him.  2. Please take all medications as prescribed.  I held your Coreg and Imdur because your blood pressure has been on the low end of normal.  These medications may be resumed once you see your doctor on Wednesday.    Resume your Xarelto and Aspirin on Saturday, 0516/2015.   3. If you have worsening of your symptoms or new symptoms arise, please call the clinic ([phone]), or go to the ER immediately if symptoms are severe.

## 2013-10-12 NOTE — Progress Notes (Signed)
Pt seen and examined with Dr. Redmond Pulling. Feels well. No CP, no SOB. Had complained of abd pain early this AM but had resolved by the time I saw him.   Pulm- CTA b/l  Cardiovascular- normal heart sounds, regular rate and rhythm  Abd- soft, non tender, non distended, BS +  Ext- No pedal edema  Gen- AAO*3, NAD  Assessment and Plan:  I have reviewed Dr. Dois Davenport note and agree with recommendations as outlined with the following additions:  - CBC stable. Will need repeat Hg at [place] office - PT recommended d/c to SNF but patient wants to go home. Son at bedside states patient will have 24 hr supervision at home. Will get home PT to work with patient - c/w PO levaquin to complete 7 day course for PNA  - GI recommendations appreciated. Will hold xarelto for 1 week total and then resume if CBC stable  - Outpatient follow up with GI and PCP  - Patient with pulmonary nodule. Will need CT with contrast for further eval. To be done as outpatient - Coreg and imdur on hold. Would resume as outpatient if BP remains stable - patient stable for d/c home today

## 2013-10-12 NOTE — Progress Notes (Signed)
Rehab Admissions Coordinator Note:  Patient was screened by Retta Diones for appropriateness for an Inpatient Acute Rehab Consult.  At this time, we are recommending [place].  Wesley Wang 10/12/2013, 11:52 AM  I can be reached at [phone].

## 2013-10-13 ENCOUNTER — Encounter: Payer: Self-pay | Admitting: Internal Medicine

## 2013-10-14 ENCOUNTER — Encounter (HOSPITAL_COMMUNITY): Payer: Self-pay | Admitting: Emergency Medicine

## 2013-10-14 ENCOUNTER — Emergency Department (HOSPITAL_COMMUNITY): Payer: Medicare Other

## 2013-10-14 ENCOUNTER — Inpatient Hospital Stay (HOSPITAL_COMMUNITY)
Admission: EM | Admit: 2013-10-14 | Discharge: 2013-11-02 | DRG: 871 | Disposition: E | Payer: Medicare Other | Attending: Oncology | Admitting: Oncology

## 2013-10-14 DIAGNOSIS — I4891 Unspecified atrial fibrillation: Secondary | ICD-10-CM | POA: Diagnosis present

## 2013-10-14 DIAGNOSIS — J189 Pneumonia, unspecified organism: Secondary | ICD-10-CM | POA: Diagnosis present

## 2013-10-14 DIAGNOSIS — Z515 Encounter for palliative care: Secondary | ICD-10-CM

## 2013-10-14 DIAGNOSIS — R109 Unspecified abdominal pain: Secondary | ICD-10-CM | POA: Diagnosis present

## 2013-10-14 DIAGNOSIS — I453 Trifascicular block: Secondary | ICD-10-CM

## 2013-10-14 DIAGNOSIS — J9819 Other pulmonary collapse: Secondary | ICD-10-CM | POA: Diagnosis present

## 2013-10-14 DIAGNOSIS — I509 Heart failure, unspecified: Secondary | ICD-10-CM | POA: Diagnosis present

## 2013-10-14 DIAGNOSIS — N183 Chronic kidney disease, stage 3 unspecified: Secondary | ICD-10-CM | POA: Diagnosis present

## 2013-10-14 DIAGNOSIS — D62 Acute posthemorrhagic anemia: Secondary | ICD-10-CM | POA: Diagnosis not present

## 2013-10-14 DIAGNOSIS — J9600 Acute respiratory failure, unspecified whether with hypoxia or hypercapnia: Secondary | ICD-10-CM

## 2013-10-14 DIAGNOSIS — K922 Gastrointestinal hemorrhage, unspecified: Secondary | ICD-10-CM | POA: Diagnosis present

## 2013-10-14 DIAGNOSIS — F0390 Unspecified dementia without behavioral disturbance: Secondary | ICD-10-CM | POA: Diagnosis present

## 2013-10-14 DIAGNOSIS — J9601 Acute respiratory failure with hypoxia: Secondary | ICD-10-CM | POA: Diagnosis present

## 2013-10-14 DIAGNOSIS — N189 Chronic kidney disease, unspecified: Secondary | ICD-10-CM

## 2013-10-14 DIAGNOSIS — I959 Hypotension, unspecified: Secondary | ICD-10-CM | POA: Diagnosis present

## 2013-10-14 DIAGNOSIS — E876 Hypokalemia: Secondary | ICD-10-CM | POA: Diagnosis not present

## 2013-10-14 DIAGNOSIS — K5520 Angiodysplasia of colon without hemorrhage: Secondary | ICD-10-CM | POA: Diagnosis present

## 2013-10-14 DIAGNOSIS — I5043 Acute on chronic combined systolic (congestive) and diastolic (congestive) heart failure: Secondary | ICD-10-CM | POA: Insufficient documentation

## 2013-10-14 DIAGNOSIS — Z8249 Family history of ischemic heart disease and other diseases of the circulatory system: Secondary | ICD-10-CM

## 2013-10-14 DIAGNOSIS — J9620 Acute and chronic respiratory failure, unspecified whether with hypoxia or hypercapnia: Secondary | ICD-10-CM | POA: Diagnosis present

## 2013-10-14 DIAGNOSIS — Z9849 Cataract extraction status, unspecified eye: Secondary | ICD-10-CM

## 2013-10-14 DIAGNOSIS — G252 Other specified forms of tremor: Secondary | ICD-10-CM

## 2013-10-14 DIAGNOSIS — E785 Hyperlipidemia, unspecified: Secondary | ICD-10-CM

## 2013-10-14 DIAGNOSIS — R0682 Tachypnea, not elsewhere classified: Secondary | ICD-10-CM | POA: Diagnosis not present

## 2013-10-14 DIAGNOSIS — E872 Acidosis, unspecified: Secondary | ICD-10-CM | POA: Diagnosis present

## 2013-10-14 DIAGNOSIS — N170 Acute kidney failure with tubular necrosis: Secondary | ICD-10-CM | POA: Diagnosis not present

## 2013-10-14 DIAGNOSIS — I2489 Other forms of acute ischemic heart disease: Secondary | ICD-10-CM | POA: Diagnosis present

## 2013-10-14 DIAGNOSIS — G250 Essential tremor: Secondary | ICD-10-CM | POA: Diagnosis present

## 2013-10-14 DIAGNOSIS — N179 Acute kidney failure, unspecified: Secondary | ICD-10-CM

## 2013-10-14 DIAGNOSIS — I248 Other forms of acute ischemic heart disease: Secondary | ICD-10-CM | POA: Diagnosis present

## 2013-10-14 DIAGNOSIS — I2510 Atherosclerotic heart disease of native coronary artery without angina pectoris: Secondary | ICD-10-CM | POA: Diagnosis present

## 2013-10-14 DIAGNOSIS — I5023 Acute on chronic systolic (congestive) heart failure: Secondary | ICD-10-CM

## 2013-10-14 DIAGNOSIS — Z8571 Personal history of Hodgkin lymphoma: Secondary | ICD-10-CM

## 2013-10-14 DIAGNOSIS — I252 Old myocardial infarction: Secondary | ICD-10-CM

## 2013-10-14 DIAGNOSIS — J90 Pleural effusion, not elsewhere classified: Secondary | ICD-10-CM | POA: Diagnosis present

## 2013-10-14 DIAGNOSIS — A419 Sepsis, unspecified organism: Principal | ICD-10-CM | POA: Diagnosis present

## 2013-10-14 DIAGNOSIS — J479 Bronchiectasis, uncomplicated: Secondary | ICD-10-CM | POA: Diagnosis present

## 2013-10-14 DIAGNOSIS — R1310 Dysphagia, unspecified: Secondary | ICD-10-CM | POA: Diagnosis present

## 2013-10-14 DIAGNOSIS — Z9861 Coronary angioplasty status: Secondary | ICD-10-CM

## 2013-10-14 DIAGNOSIS — E039 Hypothyroidism, unspecified: Secondary | ICD-10-CM | POA: Diagnosis present

## 2013-10-14 DIAGNOSIS — Z66 Do not resuscitate: Secondary | ICD-10-CM | POA: Diagnosis present

## 2013-10-14 DIAGNOSIS — R6520 Severe sepsis without septic shock: Secondary | ICD-10-CM | POA: Diagnosis present

## 2013-10-14 DIAGNOSIS — Z87891 Personal history of nicotine dependence: Secondary | ICD-10-CM

## 2013-10-14 DIAGNOSIS — I214 Non-ST elevation (NSTEMI) myocardial infarction: Secondary | ICD-10-CM

## 2013-10-14 HISTORY — DX: Cardiac arrhythmia, unspecified: I49.9

## 2013-10-14 LAB — COMPREHENSIVE METABOLIC PANEL
ALK PHOS: 81 U/L (ref 39–117)
ALT: 21 U/L (ref 0–53)
AST: 40 U/L — AB (ref 0–37)
Albumin: 2.7 g/dL — ABNORMAL LOW (ref 3.5–5.2)
BUN: 20 mg/dL (ref 6–23)
CO2: 19 mEq/L (ref 19–32)
Calcium: 8.5 mg/dL (ref 8.4–10.5)
Chloride: 105 mEq/L (ref 96–112)
Creatinine, Ser: 1.21 mg/dL (ref 0.50–1.35)
GFR calc non Af Amer: 55 mL/min — ABNORMAL LOW (ref >90)
GFR, EST AFRICAN AMERICAN: 63 mL/min — AB (ref >90)
GLUCOSE: 132 mg/dL — AB (ref 70–99)
Potassium: 4.5 mEq/L (ref 3.7–5.3)
SODIUM: 143 meq/L (ref 137–147)
TOTAL PROTEIN: 6.7 g/dL (ref 6.0–8.3)
Total Bilirubin: 1.5 mg/dL — ABNORMAL HIGH (ref 0.3–1.2)

## 2013-10-14 LAB — CBC WITH DIFFERENTIAL/PLATELET
Basophils Absolute: 0 10*3/uL (ref 0.0–0.1)
Basophils Relative: 0 % (ref 0–1)
Eosinophils Absolute: 0.1 10*3/uL (ref 0.0–0.7)
Eosinophils Relative: 0 % (ref 0–5)
HCT: 38.7 % — ABNORMAL LOW (ref 39.0–52.0)
Hemoglobin: 12 g/dL — ABNORMAL LOW (ref 13.0–17.0)
LYMPHS ABS: 1.4 10*3/uL (ref 0.7–4.0)
LYMPHS PCT: 7 % — AB (ref 12–46)
MCH: 29.1 pg (ref 26.0–34.0)
MCHC: 31 g/dL (ref 30.0–36.0)
MCV: 93.9 fL (ref 78.0–100.0)
Monocytes Absolute: 1.8 10*3/uL — ABNORMAL HIGH (ref 0.1–1.0)
Monocytes Relative: 8 % (ref 3–12)
Neutro Abs: 17.9 10*3/uL — ABNORMAL HIGH (ref 1.7–7.7)
Neutrophils Relative %: 85 % — ABNORMAL HIGH (ref 43–77)
PLATELETS: 291 10*3/uL (ref 150–400)
RBC: 4.12 MIL/uL — AB (ref 4.22–5.81)
RDW: 16.2 % — ABNORMAL HIGH (ref 11.5–15.5)
WBC: 21.2 10*3/uL — AB (ref 4.0–10.5)

## 2013-10-14 LAB — I-STAT CG4 LACTIC ACID, ED: LACTIC ACID, VENOUS: 4.04 mmol/L — AB (ref 0.5–2.2)

## 2013-10-14 LAB — TYPE AND SCREEN
ABO/RH(D): O NEG
Antibody Screen: NEGATIVE

## 2013-10-14 LAB — I-STAT ARTERIAL BLOOD GAS, ED
ACID-BASE DEFICIT: 3 mmol/L — AB (ref 0.0–2.0)
Acid-base deficit: 2 mmol/L (ref 0.0–2.0)
BICARBONATE: 19.7 meq/L — AB (ref 20.0–24.0)
Bicarbonate: 19.5 mEq/L — ABNORMAL LOW (ref 20.0–24.0)
O2 Saturation: 83 %
O2 Saturation: 94 %
PH ART: 7.48 — AB (ref 7.350–7.450)
PO2 ART: 62 mmHg — AB (ref 80.0–100.0)
Patient temperature: 98.6
TCO2: 20 mmol/L (ref 0–100)
TCO2: 20 mmol/L (ref 0–100)
pCO2 arterial: 26.2 mmHg — ABNORMAL LOW (ref 35.0–45.0)
pCO2 arterial: 25.7 mmHg — ABNORMAL LOW (ref 35.0–45.0)
pH, Arterial: 7.492 — ABNORMAL HIGH (ref 7.350–7.450)
pO2, Arterial: 43 mmHg — ABNORMAL LOW (ref 80.0–100.0)

## 2013-10-14 LAB — TSH: TSH: 1.73 u[IU]/mL (ref 0.350–4.500)

## 2013-10-14 LAB — PROTIME-INR
INR: 1.3 (ref 0.00–1.49)
Prothrombin Time: 15.9 seconds — ABNORMAL HIGH (ref 11.6–15.2)

## 2013-10-14 LAB — I-STAT TROPONIN, ED: Troponin i, poc: 1.54 ng/mL (ref 0.00–0.08)

## 2013-10-14 LAB — PRO B NATRIURETIC PEPTIDE: Pro B Natriuretic peptide (BNP): 9922 pg/mL — ABNORMAL HIGH (ref 0–450)

## 2013-10-14 LAB — MAGNESIUM: Magnesium: 2.1 mg/dL (ref 1.5–2.5)

## 2013-10-14 LAB — TROPONIN I: Troponin I: 2.15 ng/mL (ref <0.30)

## 2013-10-14 LAB — APTT: APTT: 29 s (ref 24–37)

## 2013-10-14 MED ORDER — ALBUTEROL SULFATE (2.5 MG/3ML) 0.083% IN NEBU
5.0000 mg | INHALATION_SOLUTION | Freq: Once | RESPIRATORY_TRACT | Status: AC
  Administered 2013-10-14: 5 mg via RESPIRATORY_TRACT
  Filled 2013-10-14: qty 6

## 2013-10-14 MED ORDER — METHYLPREDNISOLONE SODIUM SUCC 40 MG IJ SOLR: 40.0000 mg | Freq: Two times a day (BID) | INTRAMUSCULAR | Status: DC

## 2013-10-14 MED ORDER — FUROSEMIDE 10 MG/ML IJ SOLN
80.0000 mg | Freq: Every day | INTRAMUSCULAR | Status: DC
  Administered 2013-10-14 – 2013-10-15 (×2): 80 mg via INTRAVENOUS
  Filled 2013-10-14 (×2): qty 8

## 2013-10-14 MED ORDER — HEPARIN SODIUM (PORCINE) 5000 UNIT/ML IJ SOLN
5000.0000 [IU] | Freq: Three times a day (TID) | INTRAMUSCULAR | Status: DC
  Administered 2013-10-14 – 2013-10-22 (×22): 5000 [IU] via SUBCUTANEOUS
  Filled 2013-10-14 (×27): qty 1

## 2013-10-14 MED ORDER — SODIUM CHLORIDE 0.9 % IJ SOLN
3.0000 mL | INTRAMUSCULAR | Status: DC | PRN
Start: 2013-10-14 — End: 2013-10-15

## 2013-10-14 MED ORDER — SODIUM CHLORIDE 0.9 % IJ SOLN
3.0000 mL | Freq: Two times a day (BID) | INTRAMUSCULAR | Status: DC
  Filled 2013-10-14: qty 3

## 2013-10-14 MED ORDER — ASPIRIN EC 81 MG PO TBEC
81.0000 mg | DELAYED_RELEASE_TABLET | Freq: Every day | ORAL | Status: DC
  Administered 2013-10-16 – 2013-10-21 (×6): 81 mg via ORAL
  Filled 2013-10-14 (×8): qty 1

## 2013-10-14 MED ORDER — PIPERACILLIN-TAZOBACTAM 3.375 G IVPB
3.3750 g | Freq: Once | INTRAVENOUS | Status: AC
  Administered 2013-10-14: 3.375 g via INTRAVENOUS
  Filled 2013-10-14: qty 50

## 2013-10-14 MED ORDER — FUROSEMIDE 10 MG/ML IJ SOLN: 80.0000 mg | Freq: Every day | INTRAMUSCULAR | Status: DC

## 2013-10-14 MED ORDER — IOHEXOL 350 MG/ML SOLN
80.0000 mL | Freq: Once | INTRAVENOUS | Status: AC | PRN
Start: 2013-10-14 — End: 2013-10-14
  Administered 2013-10-14: 70 mL via INTRAVENOUS

## 2013-10-14 MED ORDER — VANCOMYCIN HCL IN DEXTROSE 1-5 GM/200ML-% IV SOLN
1000.0000 mg | Freq: Once | INTRAVENOUS | Status: AC
  Administered 2013-10-14: 1000 mg via INTRAVENOUS
  Filled 2013-10-14: qty 200

## 2013-10-14 MED ORDER — CITALOPRAM HYDROBROMIDE 20 MG PO TABS
20.0000 mg | ORAL_TABLET | Freq: Every evening | ORAL | Status: DC
  Administered 2013-10-14 – 2013-10-21 (×8): 20 mg via ORAL
  Filled 2013-10-14: qty 1
  Filled 2013-10-14: qty 2
  Filled 2013-10-14 (×7): qty 1

## 2013-10-14 MED ORDER — SIMVASTATIN 40 MG PO TABS
40.0000 mg | ORAL_TABLET | Freq: Every evening | ORAL | Status: DC
  Administered 2013-10-14: 40 mg via ORAL
  Filled 2013-10-14 (×2): qty 1

## 2013-10-14 MED ORDER — AMIODARONE HCL 200 MG PO TABS
200.0000 mg | ORAL_TABLET | Freq: Every evening | ORAL | Status: DC
  Administered 2013-10-14 – 2013-10-21 (×8): 200 mg via ORAL
  Filled 2013-10-14 (×10): qty 1

## 2013-10-14 MED ORDER — IPRATROPIUM BROMIDE 0.02 % IN SOLN: 0.5000 mg | Freq: Four times a day (QID) | RESPIRATORY_TRACT | Status: DC | PRN

## 2013-10-14 MED ORDER — SODIUM CHLORIDE 0.9 % IV SOLN: 250.0000 mL | INTRAVENOUS | Status: DC | PRN

## 2013-10-14 MED ORDER — VANCOMYCIN HCL IN DEXTROSE 750-5 MG/150ML-% IV SOLN
750.0000 mg | Freq: Two times a day (BID) | INTRAVENOUS | Status: DC
  Administered 2013-10-15 – 2013-10-16 (×3): 750 mg via INTRAVENOUS
  Filled 2013-10-14 (×4): qty 150

## 2013-10-14 MED ORDER — SODIUM CHLORIDE 0.9 % IJ SOLN
3.0000 mL | Freq: Two times a day (BID) | INTRAMUSCULAR | Status: DC
  Administered 2013-10-15 (×2): 3 mL via INTRAVENOUS

## 2013-10-14 MED ORDER — METHYLPREDNISOLONE SODIUM SUCC 125 MG IJ SOLR
80.0000 mg | Freq: Once | INTRAMUSCULAR | Status: AC
  Administered 2013-10-14: 80 mg via INTRAVENOUS
  Filled 2013-10-14: qty 2

## 2013-10-14 MED ORDER — FUROSEMIDE 10 MG/ML IJ SOLN: 40.0000 mg | Freq: Every day | INTRAMUSCULAR | Status: DC

## 2013-10-14 MED ORDER — ALBUTEROL SULFATE (2.5 MG/3ML) 0.083% IN NEBU: 2.5000 mg | INHALATION_SOLUTION | Freq: Once | RESPIRATORY_TRACT | Status: DC

## 2013-10-14 MED ORDER — ALBUTEROL SULFATE (2.5 MG/3ML) 0.083% IN NEBU: 2.5000 mg | INHALATION_SOLUTION | Freq: Four times a day (QID) | RESPIRATORY_TRACT | Status: DC | PRN

## 2013-10-14 MED ORDER — PIPERACILLIN-TAZOBACTAM 3.375 G IVPB
3.3750 g | Freq: Three times a day (TID) | INTRAVENOUS | Status: DC
  Administered 2013-10-15 – 2013-10-22 (×22): 3.375 g via INTRAVENOUS
  Filled 2013-10-14 (×28): qty 50

## 2013-10-14 MED ORDER — LEVOTHYROXINE SODIUM 50 MCG PO TABS
50.0000 ug | ORAL_TABLET | Freq: Every day | ORAL | Status: DC
  Administered 2013-10-15 – 2013-10-21 (×7): 50 ug via ORAL
  Filled 2013-10-14 (×11): qty 1

## 2013-10-14 MED ORDER — PANTOPRAZOLE SODIUM 40 MG PO TBEC
40.0000 mg | DELAYED_RELEASE_TABLET | Freq: Every day | ORAL | Status: DC
  Administered 2013-10-15 – 2013-10-21 (×6): 40 mg via ORAL
  Filled 2013-10-14 (×5): qty 1

## 2013-10-14 NOTE — ED Notes (Signed)
RT aware of order to place pt on BiPap

## 2013-10-14 NOTE — Progress Notes (Signed)
Transported patient to C.T while on Bipap. Patient remained stable during transports

## 2013-10-14 NOTE — ED Notes (Signed)
Pt presents to department for evaluation of SOB. Onset today. Was seen last week for black tarry stool, received blood transfusion. WBC count of 19.0 from PCP office. Pt states he feels more short of breath than normal. Speaking complete sentences, respirations unlabored. Pt is alert and oriented x4.

## 2013-10-14 NOTE — ED Notes (Signed)
Phlebotomy at bedside to draw 2nd set of blood cultures.

## 2013-10-14 NOTE — ED Notes (Signed)
Pt c/o increased sob, sts recently here for low hgb. Went to PCP office today and was told to come to ED for further evaluation. Daughter at bedside reports she didn't think he should have been released that early or should have gone to a rehab facility for a few days. Pt is pale in skin color. Hx of dementia. RR labored and increased. Skin warm and dry.

## 2013-10-14 NOTE — ED Notes (Signed)
Phlebotomy collected 2nd set of blood culture.

## 2013-10-14 NOTE — ED Notes (Signed)
Admitting physician at bedside

## 2013-10-14 NOTE — ED Notes (Signed)
Phlebotomy at bedside.

## 2013-10-14 NOTE — ED Notes (Signed)
X-ray at bedside

## 2013-10-14 NOTE — Progress Notes (Signed)
ANTIBIOTIC CONSULT NOTE - INITIAL  Pharmacy Consult for Vancomycin and Zosyn Indication: HCAP  Allergies  Allergen Reactions  . Sulfa Antibiotics Hives    Patient Measurements:    Vital Signs: Temp: 100 F (37.8 C) (05/13 1818) Temp src: Rectal (05/13 1818) BP: 103/79 mmHg (05/13 2145) Pulse Rate: 84 (05/13 2145) Intake/Output from previous day:   Intake/Output from this shift:    Labs:  Recent Labs  10/12/13 0654 10/30/2013 1659  WBC 11.9* 21.2*  HGB 10.4* 12.0*  PLT 250 291  CREATININE  --  1.21   The CrCl is unknown because both a height and weight (above a minimum accepted value) are required for this calculation. No results found for this basename: VANCOTROUGH, Corlis Leak, VANCORANDOM, [place], GENTPEAK, GENTRANDOM, TOBRATROUGH, TOBRAPEAK, TOBRARND, AMIKACINPEAK, AMIKACINTROU, AMIKACIN,  in the last 72 hours   Microbiology: Recent Results (from the past 720 hour(s))  MRSA PCR SCREENING     Status: None   Collection Time    10/08/13  7:23 PM      Result Value Ref Range Status   MRSA by PCR NEGATIVE  NEGATIVE Final   Comment:            The GeneXpert MRSA Assay (FDA     approved for NASAL specimens     only), is one component of a     comprehensive MRSA colonization     surveillance program. It is not     intended to diagnose MRSA     infection nor to guide or     monitor treatment for     MRSA infections.    Medical History: Past Medical History  Diagnosis Date  . Heart attack 04/1994 & 12/2001    s/p ptca but has not had stents or bypass  . Prostate disorder   . Gallstones     Cholecystectomy 2005  . Cataracts, bilateral   . Hernia   . Atrial fibrillation   . Long term (current) use of anticoagulants     on coumadin  . Memory disturbance     (05/20/13) son & pt state note problems w/keeping a schedule. Can't remember what day of the wk its is or Drs appts. Also can't remember if he has taken his meds sometimes. No problems functionally. Son  states memory loss is progressive & rapid. Short term problems.  . Chronic kidney disease   . Coronary artery disease     PTCA x 2  . Hypothyroidism   . Hyperlipidemia   . COPD (chronic obstructive pulmonary disease)   . Essential tremor   . Abdominal pain 04/22/13    RUQ  . Hodgkin lymphoma 2003    Medications:  See electronic med rec  Assessment: 80 y.o. male presents from assisted living with SOB. Recently discharged 5/11 from [facility] (treated for GIB and CAP during this hospitalization). Pt was on rocephin/azithromycin 5/7-5/10 then changed to levaquin 5/10-current. CrCl ~50 ml/min. Wbc up to 21.2. Pt now to begin broad spectrum antibiotics (Vanc and Zosyn) for HCAP. Bld cx pending. Pt received Zosyn 3.375gm IV ~1800 and Vanc 1gm IV ~1900. CXR unchanged from previous admit showing R side PNA.  Goal of Therapy:  Vancomycin trough level 15-20 mcg/ml  Plan:  1. Zosyn 3.375gm IV q8h. Each dose over 4 hours 2. Vancomycin 750mg  IV q12h. 3. Will f/u micro data, renal function, pt's clinical condition, trough prn  Sherlon Handing, PharmD, BCPS Clinical pharmacist, pager [phone] 10/24/2013,10:51 PM

## 2013-10-14 NOTE — ED Notes (Signed)
Family at bedside. 

## 2013-10-14 NOTE — Consult Note (Signed)
Cardiology Consult Note ANDY,CAMILLE L, MD  Reason for consult: CHF/NSTEMI, Assistance with management  History of Present Illness (and review of medical records): Wesley Wang is a 80 y.o. male who presents for evaluation of shortness of breath.  Patient was recently discharged from hospitalist service after recent management for GIB on 5/11.  He also was tx for PNA at that time.  His symptoms at that time were similar with regards to shortness of breath.  He had acute blood loss anemia requiring transfusions.  His   He had colonoscopy with polyp removal and cauterization of few AVMs.  ASA and Xarelto were held with plans to restart 5/16.  He and his family state that he has continued to have progressive symptoms of shortness of breath and dyspnea with mild exertion.  He has not had any increased weight gain or lower extremity edema.  He has not had any chest pain.  He was seen today by PCP for follow up after hospital discharge and family then brought him here to ED for further evaluation of low oxygen saturations in clinic.  Patient was in respiratory distress in ED requiring treatment with BiPAP.  ABG revealed hypoxia, troponin was 1.54, BNP 9922.  CTPA was negative for PE, however suggested worsening PNA.  He does also have elevated WBC and lactic acid.  Echo 07/01/2013 Study Conclusions - Left ventricle: The cavity size was mildly dilated. Wall thickness was normal. The estimated ejection fraction was 35%. Diffuse hypokinesis. Features are consistent with a pseudonormal left ventricular filling pattern, with concomitant abnormal relaxation and increased filling pressure (grade 2 diastolic dysfunction). E/medial e' > 15 suggests LV end diastolic pressure at least 20 mmHg. - Aortic valve: Trileaflet; moderately calcified leaflets. There is at least mild aortic stenosis visually but measured mean gradient not elevated. - Aorta: Ascending aortic diameter: 58m (S). - Ascending aorta: The  ascending aorta was mildly dilated. - Mitral valve: Moderately thickened annulus. Moderately calcified leaflets . Moderate regurgitation. - Left atrium: The atrium was moderately dilated. - Right ventricle: The cavity size was normal. Systolic function was mildly reduced. - Pulmonary arteries: No complete TR doppler jet so unable to estimate PA systolic pressure. - Systemic veins: IVC not visualized.  Review of Systems Constitutional: negative for chills and fevers Respiratory: negative for as per HPI Cardiovascular: as per HPi Gastrointestinal: negative for melena Hematologic/lymphatic: positive for recent GI bleed Further review of systems was otherwise negative other than stated in HPI.  Patient Active Problem List   Diagnosis Date Noted  . Acute respiratory failure with hypoxia 10/25/2013  . Sepsis 10/10/2013  . NSTEMI (non-ST elevated myocardial infarction) 10/19/2013  . Acute on chronic heart failure 10/04/2013  . Acute respiratory failure 11/01/2013  . Angiodysplasia of colon 10/10/2013  . GI bleed 10/08/2013  . Acute blood loss anemia 10/08/2013  . GIB (gastrointestinal bleeding) 10/08/2013  . Cognitive decline 06/17/2013  . CAD (coronary artery disease) 06/14/2013  . Atrial fibrillation 06/14/2013  . Trifascicular block 06/14/2013  . Anticoagulated 06/14/2013  . Benign essential tremor 04/01/2013  . H/O Hodgkin's lymphoma 04/01/2013  . Chronic kidney failure 03/25/2013  . HLD (hyperlipidemia) 01/20/2013  . Adult hypothyroidism 01/20/2013   Past Medical History  Diagnosis Date  . Heart attack 04/1994 & 12/2001    s/p ptca but has not had stents or bypass  . Prostate disorder   . Gallstones     Cholecystectomy 2005  . Cataracts, bilateral   . Hernia   . Atrial fibrillation   .  Long term (current) use of anticoagulants     on coumadin  . Memory disturbance     (05/20/13) son & pt state note problems w/keeping a schedule. Can't remember what day of the wk its  is or Drs appts. Also can't remember if he has taken his meds sometimes. No problems functionally. Son states memory loss is progressive & rapid. Short term problems.  . Chronic kidney disease   . Coronary artery disease     PTCA x 2  . Hypothyroidism   . Hyperlipidemia   . COPD (chronic obstructive pulmonary disease)   . Essential tremor   . Abdominal pain 04/22/13    RUQ  . Hodgkin lymphoma 2003    Past Surgical History  Procedure Laterality Date  . Cholecystectomy  2005  . Cataract extraction, bilateral Bilateral 11/2010, 12/2010  . Ptca  04/1994 & 12/2001    x 2   . Hemorrhoid surgery    . Transurethral resection of prostate    . Esophagogastroduodenoscopy N/A 10/09/2013    Procedure: ESOPHAGOGASTRODUODENOSCOPY (EGD);  Surgeon: [name], MD;  Location[facility] END[facility]COPY;  Service: Endoscopy;  Laterality: N/A;  . Colonoscopy N/A 10/10/2013    Procedure: COLONOSCOPY;  Surgeon: [name], MD;  Location: [facility] ENDOSCOPY;  Service: Endoscopy;  Laterality: N/A;     (Not in a hospital admission) Allergies  Allergen Reactions  . Sulfa Antibiotics Hives    History  Substance Use Topics  . Smoking status: Former Smoker    Quit date: 06/11/1990  . Smokeless tobacco: Not on file  . Alcohol Use: No     Comment: remote    Family History  Problem Relation Age of Onset  . Heart attack Mother   . Heart disease Mother   . Heart attack Brother   . Cancer Brother   . Heart disease Brother      Objective:  Patient Vitals for the past 8 hrs:  BP Temp Temp src Pulse Resp SpO2  10/18/2013 2145 103/79 mmHg - - 84 35 94 %  10/31/2013 2130 108/68 mmHg - - 91 28 93 %  10/25/2013 2000 90/59 mmHg - - 81 31 92 %  10/06/2013 1945 - - - 81 24 100 %  10/15/2013 1849 102/58 mmHg - - 87 16 100 %  10/24/2013 1845 116/68 mmHg - - 57 26 98 %  10/29/2013 1818 - 100 F (37.8 C) Rectal - - -  10/13/2013 1815 - - - 100 37 88 %  10/26/2013 1800 - - - 108 37 91 %  10/04/2013 1755 106/62 mmHg - - 93 40 92 %  10/09/2013 1730  109/55 mmHg - - - 30 -  10/03/2013 1720 - - - 90 17 100 %  10/10/2013 1717 97/54 mmHg - - - 36 -  10/24/2013 1715 - - - - 12 -  10/19/2013 1658 94/55 mmHg 98.3 F (36.8 C) Oral 94 18 76 %   General appearance: alert, cooperative, appears stated age and no distress Head: Normocephalic, without obvious abnormality, atraumatic Eyes: conjunctivae/corneas clear. PERRL, EOM's intact. Fundi benign. Neck: elevated JVP Lungs: non labored breathing, no wheezing, on NRB Chest wall: no tenderness Heart: regular rate and rhythm, S1, S2 normal, no murmur, click, rub or gallop Abdomen: soft, non-tender; bowel sounds normal; no masses,  no organomegaly Extremities: no edema Pulses: 2+ and symmetric Neurologic: Grossly normal  Results for orders placed during the hospital encounter of 10/24/2013 (from the past 48 hour(s))  CBC WITH DIFFERENTIAL  Status: Abnormal   Collection Time    10/08/2013  4:59 PM      Result Value Ref Range   WBC 21.2 (*) 4.0 - 10.5 K/uL   RBC 4.12 (*) 4.22 - 5.81 MIL/uL   Hemoglobin 12.0 (*) 13.0 - 17.0 g/dL   HCT 38.7 (*) 39.0 - 52.0 %   MCV 93.9  78.0 - 100.0 fL   MCH 29.1  26.0 - 34.0 pg   MCHC 31.0  30.0 - 36.0 g/dL   RDW 16.2 (*) 11.5 - 15.5 %   Platelets 291  150 - 400 K/uL   Neutrophils Relative % 85 (*) 43 - 77 %   Neutro Abs 17.9 (*) 1.7 - 7.7 K/uL   Lymphocytes Relative 7 (*) 12 - 46 %   Lymphs Abs 1.4  0.7 - 4.0 K/uL   Monocytes Relative 8  3 - 12 %   Monocytes Absolute 1.8 (*) 0.1 - 1.0 K/uL   Eosinophils Relative 0  0 - 5 %   Eosinophils Absolute 0.1  0.0 - 0.7 K/uL   Basophils Relative 0  0 - 1 %   Basophils Absolute 0.0  0.0 - 0.1 K/uL  COMPREHENSIVE METABOLIC PANEL     Status: Abnormal   Collection Time    10/05/2013  4:59 PM      Result Value Ref Range   Sodium 143  137 - 147 mEq/L   Potassium 4.5  3.7 - 5.3 mEq/L   Chloride 105  96 - 112 mEq/L   CO2 19  19 - 32 mEq/L   Glucose, Bld 132 (*) 70 - 99 mg/dL   BUN 20  6 - 23 mg/dL   Creatinine, Ser 1.21   0.50 - 1.35 mg/dL   Calcium 8.5  8.4 - 10.5 mg/dL   Total Protein 6.7  6.0 - 8.3 g/dL   Albumin 2.7 (*) 3.5 - 5.2 g/dL   AST 40 (*) 0 - 37 U/L   ALT 21  0 - 53 U/L   Alkaline Phosphatase 81  39 - 117 U/L   Total Bilirubin 1.5 (*) 0.3 - 1.2 mg/dL   GFR calc non Af Amer 55 (*) >90 mL/min   GFR calc Af Amer 63 (*) >90 mL/min   Comment: (NOTE)     The eGFR has been calculated using the CKD EPI equation.     This calculation has not been validated in all clinical situations.     eGFR's persistently <90 mL/min signify possible Chronic Kidney     Disease.  PROTIME-INR     Status: Abnormal   Collection Time    10/10/2013  4:59 PM      Result Value Ref Range   Prothrombin Time 15.9 (*) 11.6 - 15.2 seconds   INR 1.30  0.00 - 1.49  APTT     Status: None   Collection Time    10/07/2013  4:59 PM      Result Value Ref Range   aPTT 29  24 - 37 seconds  PRO B NATRIURETIC PEPTIDE     Status: Abnormal   Collection Time    10/31/2013  5:05 PM      Result Value Ref Range   Pro B Natriuretic peptide (BNP) 9922.0 (*) 0 - 450 pg/mL  TYPE AND SCREEN     Status: None   Collection Time    10/18/2013  5:19 PM      Result Value Ref Range   ABO/RH(D) O NEG  Antibody Screen NEG     Sample Expiration 10/17/2013    Randolm Idol, ED     Status: Abnormal   Collection Time    10/06/2013  5:33 PM      Result Value Ref Range   Troponin i, poc 1.54 (*) 0.00 - 0.08 ng/mL   Comment NOTIFIED PHYSICIAN     Comment 3            Comment: Due to the release kinetics of cTnI,     a negative result within the first hours     of the onset of symptoms does not rule out     myocardial infarction with certainty.     If myocardial infarction is still suspected,     repeat the test at appropriate intervals.  I-STAT CG4 LACTIC ACID, ED     Status: Abnormal   Collection Time    10/30/2013  5:35 PM      Result Value Ref Range   Lactic Acid, Venous 4.04 (*) 0.5 - 2.2 mmol/L  I-STAT ARTERIAL BLOOD GAS, ED     Status:  Abnormal   Collection Time    10/09/2013  6:01 PM      Result Value Ref Range   pH, Arterial 7.480 (*) 7.350 - 7.450   pCO2 arterial 26.2 (*) 35.0 - 45.0 mmHg   pO2, Arterial 43.0 (*) 80.0 - 100.0 mmHg   Bicarbonate 19.5 (*) 20.0 - 24.0 mEq/L   TCO2 20  0 - 100 mmol/L   O2 Saturation 83.0     Acid-base deficit 3.0 (*) 0.0 - 2.0 mmol/L   Patient temperature 98.6 F     Collection site RADIAL, ALLEN'S TEST ACCEPTABLE     Drawn by Operator     Sample type ARTERIAL    I-STAT ARTERIAL BLOOD GAS, ED     Status: Abnormal   Collection Time    10/18/2013 11:07 PM      Result Value Ref Range   pH, Arterial 7.492 (*) 7.350 - 7.450   pCO2 arterial 25.7 (*) 35.0 - 45.0 mmHg   pO2, Arterial 62.0 (*) 80.0 - 100.0 mmHg   Bicarbonate 19.7 (*) 20.0 - 24.0 mEq/L   TCO2 20  0 - 100 mmol/L   O2 Saturation 94.0     Acid-base deficit 2.0  0.0 - 2.0 mmol/L   Patient temperature 98.6 F     Collection site RADIAL, ALLEN'S TEST ACCEPTABLE     Drawn by Operator     Sample type ARTERIAL     Ct Angio Chest Pe W/cm &/or Wo Cm  10/28/2013   CLINICAL DATA:  Shortness of breath  EXAM: CT ANGIOGRAPHY CHEST WITH CONTRAST  TECHNIQUE: Multidetector CT imaging of the chest was performed using the standard protocol during bolus administration of intravenous contrast. Multiplanar CT image reconstructions and MIPs were obtained to evaluate the vascular anatomy.  CONTRAST:  11m OMNIPAQUE IOHEXOL 350 MG/ML SOLN  COMPARISON:  None.  FINDINGS: The lungs are well aerated bilaterally. Diffuse emphysematous changes are seen. There are changes predominately within the right lower lobe as well as within the right upper lobe to a lesser degree consistent with bronchiectasis. Some of these changes are chronic in nature is seen on prior plain film examination. There is likely a degree of acute on chronic infiltrate. A small right-sided pleural effusion is noted. Some mild changes are noted on the left likely of a chronic nature as well.   Calcification of thoracic aorta is noted. The  opacification is poor. Pulmonary artery demonstrates a normal branching pattern. No definitive filling defects to suggest pulmonary emboli are identified. No significant hilar or mediastinal adenopathy is seen. Heavy coronary calcifications are noted.  Scanning into the upper abdomen reveals no acute abnormality. The bony structures are within normal limits.  Review of the MIP images confirms the above findings.  IMPRESSION: Acute on chronic infiltrate in the right lung with associated right-sided effusion.  Mild similar chronic changes are noted scattered throughout the left lung.  Changes of bronchiectasis are noted bilaterally.  No evidence of pulmonary emboli is seen.   Electronically Signed   By: Inez Catalina M.D.   On: 10/27/2013 20:54   Dg Chest Portable 1 View  10/18/2013   CLINICAL DATA:  Shortness of breath  EXAM: PORTABLE CHEST - 1 VIEW  COMPARISON:  10/09/2013  FINDINGS: Stable diffuse ill-defined airspace process throughout the right lung concerning for right lung pneumonia. No developing effusion or pneumothorax. Left lung remains clear. Minor left base atelectasis. Stable left midlung calcified granuloma. Borderline heart size without superimposed edema or CHF. Atherosclerosis noted of the aorta. Trachea is midline.  IMPRESSION: Similar diffuse right lung airspace process compatible with pneumonia.   Electronically Signed   By: Daryll Brod M.D.   On: 10/19/2013 17:40    ECG:  Atrial fibrillation HR 98, RBBB, LAPB, last two ecgs were sinus rhythm  Impression: Sepsis due to PNA Acute Hypoxic Respiratory Failure Acute on chronic systolic Heart Failure NSTEMI/Type II MI Atrial Fibrillation Recent Acute blood loss anemia 2/2 GIB  Recommendations: -No indications for urgent invasive cardiac assessment at this time.  Had recent 2D echo 3 months prior. -ASA 35m, monitor H/H, signs of active bleeding -cycle troponins, close monitoring on  telemetry, ekg prn for chest pain or arrythmia -No oral or IV anticoagulation -IV Lasix 433mBID for diuresis -Strict I/Os, daily weights -Continue Amiodarone -Will consider restarting BB when more euvolemic, and if BP will tolerate -Antibiotics, supplemental oxygenation, aggressive pulmonary toilet as per primary team.  Thank you for this consult. Please call with questions.

## 2013-10-14 NOTE — H&P (Signed)
Date: 10/25/2013               Patient Name:  Wesley Wang MRN: [id number]  DOB: 22-Dec-1931 Age / Sex: 80 y.o., male   PCP: Leamon Arnt, MD         Medical Service: Internal Medicine Teaching Service         Attending Physician: Dr. Aldine Contes, MD    First Contact: Dr. [name] Pager: [phone]  Second Contact: Dr. Clinton Gallant Pager: [phone]       After Hours (After 5p/  First Contact Pager: [phone]  weekends / holidays): Second Contact Pager: [phone]   Chief Complaint: SOB  History of Present Illness: Wesley Wang is a 80 yo white male with PMH of CAD, Atrial fibrillation, CKD, benign essential tremor and hx of Hodgkin's lymphoma, he was recently discharged from [facility] on 10/12/13 after treatment for symptomatic anemia from acute GIB as well as community acquired pneumonia.  History today is mostly provided by patient's daughter in law at the bedside.  Patient on bipap currently and confirms history that she provides.  Daughter reports patient was somewhat still SOB on discharge but they figured that would improve with the continued treatment of antibiotics.  She reports he lives at an assisted living facility but that a member of the family visits at least once a day and gives him his medication.  She reports that on Tuesday he did not want to go to community dinning and was brought some food but has had poor PO intake.  When she came to see him today she noted his SOB was worse and he still was not eating and felt he should come back to the hospital for evaluation.  In the ED he was found to have acute respiratory failure, with an O2 sat of 76, an ABG obtained showed an pO2 of 43.  A CXR was unchanged from previous and suggested a right side pneumonia.  A pro BNP was checked and was elevated at 9900.  His initial troponin was elevated at 1.54.  EKG unchanged from previous.  A CTA has been ordered.  His respiratory status has improved on BIPAP. Of note on his previous  hospitalization he did receive 2 units of PRBC. He also was discharged with instructions to hold Xarelto until May 16th given his GI bleed., his BB and IMDUR were also held on discharge given hypotension.     Meds: Current Facility-Administered Medications  Medication Dose Route Frequency Provider Last Rate Last Dose  . vancomycin (VANCOCIN) IVPB 1000 mg/200 mL premix  1,000 mg Intravenous Once Leota Jacobsen, MD 200 mL/hr at 10/02/2013 1856 1,000 mg at 10/20/2013 1856   Current Outpatient Prescriptions  Medication Sig Dispense Refill  . amiodarone (PACERONE) 200 MG tablet Take 1 tablet (200 mg total) by mouth every evening.  30 tablet  6  . citalopram (CELEXA) 20 MG tablet Take 20 mg by mouth every evening.       Marland Kitchen levofloxacin (LEVAQUIN) 500 MG tablet Take 1 tablet (500 mg total) by mouth daily.  2 tablet  0  . levothyroxine (SYNTHROID, LEVOTHROID) 50 MCG tablet Take 50 mcg by mouth daily before breakfast.       . Liniments (SALONPAS PAIN RELIEF PATCH) PADS Apply 1 each topically daily as needed (for back pain).      . naproxen sodium (ALEVE) 220 MG tablet Take 220 mg by mouth daily as needed (for pain).      Marland Kitchen  pantoprazole (PROTONIX) 40 MG tablet Take 1 tablet (40 mg total) by mouth daily.  30 tablet  1  . potassium chloride SA (K-DUR,KLOR-CON) 20 MEQ tablet Take 20 mEq by mouth 2 (two) times daily.      . simvastatin (ZOCOR) 40 MG tablet Take 40 mg by mouth every evening.       Marland Kitchen NITROSTAT 0.4 MG SL tablet Place 0.4 mg under the tongue every 5 (five) minutes as needed for chest pain.       . Rivaroxaban (XARELTO) 15 MG TABS tablet Take 15 mg by mouth daily with supper.        Allergies: Allergies as of 10/26/2013 - Review Complete 10/06/2013  Allergen Reaction Noted  . Sulfa antibiotics Hives 06/02/2013   Past Medical History  Diagnosis Date  . Heart attack 04/1994 & 12/2001    s/p ptca but has not had stents or bypass  . Prostate disorder   . Gallstones     Cholecystectomy 2005  .  Cataracts, bilateral   . Hernia   . Atrial fibrillation   . Long term (current) use of anticoagulants     on coumadin  . Memory disturbance     (05/20/13) son & pt state note problems w/keeping a schedule. Can't remember what day of the wk its is or Drs appts. Also can't remember if he has taken his meds sometimes. No problems functionally. Son states memory loss is progressive & rapid. Short term problems.  . Chronic kidney disease   . Coronary artery disease     PTCA x 2  . Hypothyroidism   . Hyperlipidemia   . COPD (chronic obstructive pulmonary disease)   . Essential tremor   . Abdominal pain 04/22/13    RUQ  . Hodgkin lymphoma 2003   Past Surgical History  Procedure Laterality Date  . Cholecystectomy  2005  . Cataract extraction, bilateral Bilateral 11/2010, 12/2010  . Ptca  04/1994 & 12/2001    x 2   . Hemorrhoid surgery    . Transurethral resection of prostate    . Esophagogastroduodenoscopy N/A 10/09/2013    Procedure: ESOPHAGOGASTRODUODENOSCOPY (EGD);  Surgeon: [name], MD;  Location: [facility] ENDOSCOPY;  Service: Endoscopy;  Laterality: N/A;  . Colonoscopy N/A 10/10/2013    Procedure: COLONOSCOPY;  Surgeon: [name], MD;  Location: [facility] ENDOSCOPY;  Service: Endoscopy;  Laterality: N/A;   Family History  Problem Relation Age of Onset  . Heart attack Mother   . Heart disease Mother   . Heart attack Brother   . Cancer Brother   . Heart disease Brother    History   Social History  . Marital Status: Married    Spouse Name: N/A    Number of Children: N/A  . Years of Education: N/A   Occupational History  . Not on file.   Social History Main Topics  . Smoking status: Former Smoker    Quit date: 06/11/1990  . Smokeless tobacco: Not on file  . Alcohol Use: No     Comment: remote  . Drug Use: No  . Sexual Activity: Not Currently   Other Topics Concern  . Not on file   Social History Narrative   Pt live in [place] independent living.  Moved to  [place] from [place] 7/14.  Widower.  Retired.  Previously worked as an Art gallery manager.  He worked on the [employer].  He contracted through [employer] for [employer].    Review of Systems: ROS  provided by daughter in law Review of Systems  Constitutional: Negative for fever, chills, weight loss and malaise/fatigue.  Respiratory: Positive for shortness of breath.   Cardiovascular: Negative for chest pain and leg swelling.  Gastrointestinal: Negative for heartburn.  Genitourinary: Negative for dysuria.     Physical Exam: Blood pressure 102/58, pulse 87, temperature 100 F (37.8 C), temperature source Rectal, resp. rate 16, SpO2 100.00%. Physical Exam  Nursing note and vitals reviewed. HENT:  Head: Normocephalic and atraumatic.  Eyes: Conjunctivae and EOM are normal.  Cardiovascular: Normal rate, regular rhythm, normal heart sounds and intact distal pulses.   Pulmonary/Chest: Effort normal. No respiratory distress. He has no wheezes. He has rales (Left mid lung crackes).  On BIPAP  Abdominal: Soft. Bowel sounds are normal. He exhibits no distension. There is no tenderness. There is no rebound and no guarding.  Musculoskeletal: He exhibits no edema.  Neurological:  Patient awake alert, oriented to person, place [place]), and time (May 2015)  Skin: Skin is warm and dry.     Lab results: Basic Metabolic Panel:  Recent Labs  10/24/2013 1659  NA 143  K 4.5  CL 105  CO2 19  GLUCOSE 132*  BUN 20  CREATININE 1.21  CALCIUM 8.5   Liver Function Tests:  Recent Labs  10/02/2013 1659  AST 40*  ALT 21  ALKPHOS 81  BILITOT 1.5*  PROT 6.7  ALBUMIN 2.7*   No results found for this basename: LIPASE, AMYLASE,  in the last 72 hours No results found for this basename: AMMONIA,  in the last 72 hours CBC:  Recent Labs  10/12/13 0654 10/10/2013 1659  WBC 11.9* 21.2*  NEUTROABS  --  17.9*  HGB 10.4* 12.0*  HCT 32.9* 38.7*  MCV 93.2 93.9  PLT 250 291    Cardiac Enzymes: No results found for this basename: CKTOTAL, CKMB, CKMBINDEX, TROPONINI,  in the last 72 hours BNP:  Recent Labs  10/08/2013 1705  PROBNP 9922.0*   Coagulation:  Recent Labs  10/10/2013 1659  LABPROT 15.9*  INR 1.30   Imaging results:  Ct Angio Chest Pe W/cm &/or Wo Cm  10/28/2013   CLINICAL DATA:  Shortness of breath  EXAM: CT ANGIOGRAPHY CHEST WITH CONTRAST  TECHNIQUE: Multidetector CT imaging of the chest was performed using the standard protocol during bolus administration of intravenous contrast. Multiplanar CT image reconstructions and MIPs were obtained to evaluate the vascular anatomy.  CONTRAST:  58mL OMNIPAQUE IOHEXOL 350 MG/ML SOLN  COMPARISON:  None.  FINDINGS: The lungs are well aerated bilaterally. Diffuse emphysematous changes are seen. There are changes predominately within the right lower lobe as well as within the right upper lobe to a lesser degree consistent with bronchiectasis. Some of these changes are chronic in nature is seen on prior plain film examination. There is likely a degree of acute on chronic infiltrate. A small right-sided pleural effusion is noted. Some mild changes are noted on the left likely of a chronic nature as well.  Calcification of thoracic aorta is noted. The opacification is poor. Pulmonary artery demonstrates a normal branching pattern. No definitive filling defects to suggest pulmonary emboli are identified. No significant hilar or mediastinal adenopathy is seen. Heavy coronary calcifications are noted.  Scanning into the upper abdomen reveals no acute abnormality. The bony structures are within normal limits.  Review of the MIP images confirms the above findings.  IMPRESSION: Acute on chronic infiltrate in the right lung with associated right-sided effusion.  Mild similar chronic changes  are noted scattered throughout the left lung.  Changes of bronchiectasis are noted bilaterally.  No evidence of pulmonary emboli is seen.    Electronically Signed   By: Inez Catalina M.D.   On: 10/19/2013 20:54   Dg Chest Portable 1 View  10/13/2013   CLINICAL DATA:  Shortness of breath  EXAM: PORTABLE CHEST - 1 VIEW  COMPARISON:  10/09/2013  FINDINGS: Stable diffuse ill-defined airspace process throughout the right lung concerning for right lung pneumonia. No developing effusion or pneumothorax. Left lung remains clear. Minor left base atelectasis. Stable left midlung calcified granuloma. Borderline heart size without superimposed edema or CHF. Atherosclerosis noted of the aorta. Trachea is midline.  IMPRESSION: Similar diffuse right lung airspace process compatible with pneumonia.   Electronically Signed   By: Daryll Brod M.D.   On: 10/15/2013 17:40    Other results: EKG: A fib, with RBBB, rate ~104, no ST elevation, prolonged QTc at 617,  Previous EKG 10/08/13 sinus with RBBB and less prolonged QT.  Assessment & Plan by Problem:   Acute respiratory failure with hypoxia in setting of severe sepsis>>> multifactorial - Patient with SIRS criteria and Right lung infiltrate, suprisingly patient is afebrile but has elevated lactic acid and NSTEMI. And has failed CAP treatment with Levaquin (on discharge). He also has known combined CHF with an elevated proBNP suggesting acute exacerbation of his congestive heart failure.  Fortunately no evidence of PE on CTA, although evidence of acute on chonic infiltrate with right side effusion. - Admit to stepdown - escalation of antibiotics with IV Vanc and Zosyn - Continue BIPAP, confirmed with patient that he does not want intubation even for potentially treatable cause - Discussed this complicated case with Dr. Leonidas Romberg PCCM who reviewed his CTA.  He suggests this appears like an atypical infiltrate verus pneumonitis.  Agrees with broad spectrum ABx but would also check procalcintonin to aid in potentially stopping antibiotics.  Also would start steroids and diuresis for CHF.  Will see patient in AM,  unless requested to see more urgently. - Procalcintonin - Solumedrol 80mg  now, then 40mg  BID starting tomorrow - Lasix 80mg  now. (monitor UOP) - Consider thoracentesis      NSTEMI (non-ST elevated myocardial infarction) -In setting of Severe sepsis. And Acute on Chronic CHF  Likely due to demand ischemia.  Patient not heparin candidate due to recent GI bleed.  - Cardiology consulted will see tonight - Trend troponin - ASA 81mg     Atrial fibrillation - BB held on last discharge given hypotension. - Currently no A/C due to recent GI bleed.  Anemia Improving - No signs of active bleeding, hgb trended up to 12.0.    Chronic kidney disease stage III - Creatine at baseline 1.2.    Adult hypothyroidism - Check TSH - Continue Synthroid 4mcg    Acute on chronic combined heart failure - patient with acute respiratory failure, elevated proBNP, last echo 07/01/13 with EF 35% and grade 2 DD. - Coreg held on last discharge, who not restart in acute exacerbation - Lasix 80mg  IV once (monitor UOP) - Strict I&O - Daily weights  DVT PPx: SCDs (recent GI bleed) Diet:NPO, sips with meds Code Status: DNR/DNI. Patient oriented and understands he is in respiratory failure but does not want to be intubated. Expresses wish for DNR. Daughter in law at bedside and reports she understands, her husband is on his way Education officer, community) Dispo: Disposition is deferred at this time, awaiting improvement of current medical problems.   The  patient does have a current PCP Leamon Arnt, MD) and does not need an [facility] hospital follow-up appointment after discharge.  The patient does not have transportation limitations that hinder transportation to clinic appointments.  Signed: Joni Reining, DO 10/07/2013, 7:10 PM

## 2013-10-14 NOTE — ED Provider Notes (Signed)
CSN: [id number]     Arrival date & time 11/01/2013  1642 History   First MD Initiated Contact with Patient 10/18/2013 1703     Chief Complaint  Patient presents with  . Shortness of Breath     (Consider location/radiation/quality/duration/timing/severity/associated sxs/prior Treatment) Patient is a 80 y.o. male presenting with shortness of breath. The history is provided by the patient and a relative.  Shortness of Breath  patient here complaining of worsening shortness of breath x4 days. Was discharged from the hospital 2 days ago after an admission for GI bleed. Denies any black or bloody stools at this time. Does note increased dyspnea on exertion is worse upon. Seen by his primary care Dr. today and had a CBC which showed an increase in leukocytosis with a white count 19,000. No vomiting or diarrhea. Symptoms somewhat better with rest. Denies any anginal type chest pain. No treatment used prior to arrival.  Past Medical History  Diagnosis Date  . Heart attack 04/1994 & 12/2001    s/p ptca but has not had stents or bypass  . Prostate disorder   . Gallstones     Cholecystectomy 2005  . Cataracts, bilateral   . Hernia   . Atrial fibrillation   . Long term (current) use of anticoagulants     on coumadin  . Memory disturbance     (05/20/13) son & pt state note problems w/keeping a schedule. Can't remember what day of the wk its is or Drs appts. Also can't remember if he has taken his meds sometimes. No problems functionally. Son states memory loss is progressive & rapid. Short term problems.  . Chronic kidney disease   . Coronary artery disease     PTCA x 2  . Hypothyroidism   . Hyperlipidemia   . COPD (chronic obstructive pulmonary disease)   . Essential tremor   . Abdominal pain 04/22/13    RUQ  . Hodgkin lymphoma 2003   Past Surgical History  Procedure Laterality Date  . Cholecystectomy  2005  . Cataract extraction, bilateral Bilateral 11/2010, 12/2010  . Ptca  04/1994 & 12/2001     x 2   . Hemorrhoid surgery    . Transurethral resection of prostate    . Esophagogastroduodenoscopy N/A 10/09/2013    Procedure: ESOPHAGOGASTRODUODENOSCOPY (EGD);  Surgeon: [name], MD;  Location: [facility] ENDOSCOPY;  Service: Endoscopy;  Laterality: N/A;  . Colonoscopy N/A 10/10/2013    Procedure: COLONOSCOPY;  Surgeon: [name], MD;  Location: [facility] END[facility]COPY;  Service: Endoscopy;  Laterality: N/A;   Family History  Problem Relation Age of Onset  . Heart attack Mother   . Heart disease Mother   . Heart attack Brother   . Cancer Brother   . Heart disease Brother    History  Substance Use Topics  . Smoking status: Former Smoker    Quit date: 06/11/1990  . Smokeless tobacco: Not on file  . Alcohol Use: No     Comment: remote    Review of Systems  Respiratory: Positive for shortness of breath.   All other systems reviewed and are negative.     Allergies  Sulfa antibiotics  Home Medications   Prior to Admission medications   Medication Sig Start Date End Date Taking? Authorizing Provider  amiodarone (PACERONE) 200 MG tablet Take 1 tablet (200 mg total) by mouth every evening. 07/31/13   Thompson Grayer, MD  citalopram (CELEXA) 20 MG tablet Take 20 mg by mouth every evening.  05/18/13  Historical Provider, MD  KLOR-CON M20 20 MEQ tablet Take 20 mEq by mouth 2 (two) times daily.  04/28/13   Historical Provider, MD  levofloxacin (LEVAQUIN) 500 MG tablet Take 1 tablet (500 mg total) by mouth daily. 10/12/13   [name], DO  levothyroxine (SYNTHROID, LEVOTHROID) 50 MCG tablet Take 50 mcg by mouth daily before breakfast.  05/05/13   Historical Provider, MD  NITROSTAT 0.4 MG SL tablet Place 0.4 mg under the tongue every 5 (five) minutes as needed for chest pain.  03/24/13   Historical Provider, MD  pantoprazole (PROTONIX) 40 MG tablet Take 1 tablet (40 mg total) by mouth daily. 10/12/13   [name], DO  simvastatin (ZOCOR) 40 MG tablet Take 40 mg by mouth every evening.  05/06/13    Historical Provider, MD   BP 94/55  Pulse 94  Temp(Src) 98.3 F (36.8 C) (Oral)  Resp 18  SpO2 76% Physical Exam  Nursing note and vitals reviewed. Constitutional: He is oriented to person, place, and time. He appears well-developed and well-nourished.  Non-toxic appearance. No distress.  HENT:  Head: Normocephalic and atraumatic.  Eyes: Conjunctivae, EOM and lids are normal. Pupils are equal, round, and reactive to light.  Neck: Normal range of motion. Neck supple. No tracheal deviation present. No mass present.  Cardiovascular: Normal rate, regular rhythm and normal heart sounds.  Exam reveals no gallop.   No murmur heard. Pulmonary/Chest: Effort normal. No stridor. No respiratory distress. He has decreased breath sounds. He has no wheezes. He has rhonchi. He has no rales.  Abdominal: Soft. Normal appearance and bowel sounds are normal. He exhibits no distension. There is no tenderness. There is no rebound and no CVA tenderness.  Musculoskeletal: Normal range of motion. He exhibits no edema and no tenderness.  Neurological: He is alert and oriented to person, place, and time. He has normal strength. No cranial nerve deficit or sensory deficit. GCS eye subscore is 4. GCS verbal subscore is 5. GCS motor subscore is 6.  Skin: Skin is warm and dry. No abrasion and no rash noted.  Psychiatric: He has a normal mood and affect. His speech is normal and behavior is normal.    ED Course  Procedures (including critical care time) Labs Review Labs Reviewed  CBC WITH DIFFERENTIAL  COMPREHENSIVE METABOLIC PANEL  PRO B NATRIURETIC PEPTIDE  PROTIME-INR  APTT  BLOOD GAS, ARTERIAL  I-STAT TROPOININ, ED  I-STAT CG4 LACTIC ACID, ED  TYPE AND SCREEN    Imaging Review No results found.   Date: 10/13/2013  Rate: 94  Rhythm: atrial flutter  QRS Axis: right  Intervals: normal  ST/T Wave abnormalities: nonspecific ST changes  Conduction Disutrbances:none  Narrative Interpretation:   Old  EKG Reviewed: none available    MDM   Final diagnoses:  None    Patient given oxygen here for his hypoxemia. Sats improved to the high 80s. He felt better and chest x-ray consistent with pneumonia. Level II sepsis called. Blood cultures obtained and patient started on antibiotics. Increased BMP noted. Patient's work of breathing has worsened and will place him BiPAP. Blood pressure has stabilized with a systolic of 161. Patient will be admitted to step down by the  internal medicine service.   CRITICAL CARE Performed by: Leota Jacobsen Total critical care time: 60 Critical care time was exclusive of separately billable procedures and treating other patients. Critical care was necessary to treat or prevent imminent or life-threatening deterioration. Critical care was time spent personally by  me on the following activities: development of treatment plan with patient and/or surrogate as well as nursing, discussions with consultants, evaluation of patient's response to treatment, examination of patient, obtaining history from patient or surrogate, ordering and performing treatments and interventions, ordering and review of laboratory studies, ordering and review of radiographic studies, pulse oximetry and re-evaluation of patient's condition.    Leota Jacobsen, MD 10/16/2013 [phone]

## 2013-10-14 NOTE — ED Notes (Signed)
Registration at bedside.

## 2013-10-14 NOTE — ED Notes (Signed)
Zenia Resides, MD notified of abnormal lab test results

## 2013-10-14 NOTE — Progress Notes (Signed)
Arterial blood gas drawn while patient was on Bipap 10/5 with oxygen set at 50%. Removed patient from Bipap and placed on 50% venturi mask after arterial blood gas results. Will continue to monitor patients status

## 2013-10-15 ENCOUNTER — Encounter (HOSPITAL_COMMUNITY): Payer: Self-pay | Admitting: General Practice

## 2013-10-15 ENCOUNTER — Inpatient Hospital Stay (HOSPITAL_COMMUNITY): Payer: Medicare Other

## 2013-10-15 DIAGNOSIS — I4891 Unspecified atrial fibrillation: Secondary | ICD-10-CM

## 2013-10-15 DIAGNOSIS — N183 Chronic kidney disease, stage 3 unspecified: Secondary | ICD-10-CM

## 2013-10-15 DIAGNOSIS — E039 Hypothyroidism, unspecified: Secondary | ICD-10-CM

## 2013-10-15 DIAGNOSIS — I214 Non-ST elevation (NSTEMI) myocardial infarction: Secondary | ICD-10-CM

## 2013-10-15 DIAGNOSIS — R6520 Severe sepsis without septic shock: Secondary | ICD-10-CM

## 2013-10-15 DIAGNOSIS — J9600 Acute respiratory failure, unspecified whether with hypoxia or hypercapnia: Secondary | ICD-10-CM

## 2013-10-15 DIAGNOSIS — J189 Pneumonia, unspecified organism: Secondary | ICD-10-CM | POA: Diagnosis present

## 2013-10-15 DIAGNOSIS — A419 Sepsis, unspecified organism: Secondary | ICD-10-CM

## 2013-10-15 DIAGNOSIS — I2510 Atherosclerotic heart disease of native coronary artery without angina pectoris: Secondary | ICD-10-CM

## 2013-10-15 DIAGNOSIS — I5043 Acute on chronic combined systolic (congestive) and diastolic (congestive) heart failure: Secondary | ICD-10-CM

## 2013-10-15 DIAGNOSIS — D649 Anemia, unspecified: Secondary | ICD-10-CM

## 2013-10-15 DIAGNOSIS — J479 Bronchiectasis, uncomplicated: Secondary | ICD-10-CM

## 2013-10-15 LAB — BASIC METABOLIC PANEL
BUN: 21 mg/dL (ref 6–23)
CALCIUM: 8.5 mg/dL (ref 8.4–10.5)
CO2: 24 meq/L (ref 19–32)
Chloride: 102 mEq/L (ref 96–112)
Creatinine, Ser: 1.4 mg/dL — ABNORMAL HIGH (ref 0.50–1.35)
GFR calc Af Amer: 53 mL/min — ABNORMAL LOW (ref >90)
GFR calc non Af Amer: 46 mL/min — ABNORMAL LOW (ref >90)
GLUCOSE: 146 mg/dL — AB (ref 70–99)
Potassium: 4.1 mEq/L (ref 3.7–5.3)
Sodium: 143 mEq/L (ref 137–147)

## 2013-10-15 LAB — TROPONIN I
Troponin I: 1.82 ng/mL (ref <0.30)
Troponin I: 1.39 ng/mL (ref <0.30)

## 2013-10-15 LAB — CBC
HEMATOCRIT: 36.5 % — AB (ref 39.0–52.0)
HEMOGLOBIN: 11.5 g/dL — AB (ref 13.0–17.0)
MCH: 29.4 pg (ref 26.0–34.0)
MCHC: 31.5 g/dL (ref 30.0–36.0)
MCV: 93.4 fL (ref 78.0–100.0)
PLATELETS: 249 10*3/uL (ref 150–400)
RBC: 3.91 MIL/uL — AB (ref 4.22–5.81)
RDW: 16.2 % — ABNORMAL HIGH (ref 11.5–15.5)
WBC: 16.3 10*3/uL — ABNORMAL HIGH (ref 4.0–10.5)

## 2013-10-15 LAB — PROCALCITONIN
Procalcitonin: 0.11 ng/mL
Procalcitonin: 0.13 ng/mL

## 2013-10-15 LAB — MRSA PCR SCREENING: MRSA by PCR: NEGATIVE

## 2013-10-15 MED ORDER — SODIUM CHLORIDE 0.9 % IV SOLN: 250.0000 mL | INTRAVENOUS | Status: DC | PRN

## 2013-10-15 MED ORDER — DEXTROSE 5 % IV SOLN
500.0000 mg | INTRAVENOUS | Status: DC
  Administered 2013-10-15 – 2013-10-17 (×3): 500 mg via INTRAVENOUS
  Filled 2013-10-15 (×3): qty 500

## 2013-10-15 MED ORDER — SODIUM CHLORIDE 0.9 % IJ SOLN: 3.0000 mL | INTRAMUSCULAR | Status: DC | PRN

## 2013-10-15 MED ORDER — DIAZEPAM 5 MG PO TABS: 5.0000 mg | ORAL_TABLET | ORAL | Status: DC

## 2013-10-15 MED ORDER — SODIUM CHLORIDE 0.9 % IJ SOLN
3.0000 mL | Freq: Two times a day (BID) | INTRAMUSCULAR | Status: DC
  Administered 2013-10-15 – 2013-10-17 (×3): 3 mL via INTRAVENOUS

## 2013-10-15 MED ORDER — SODIUM CHLORIDE 0.9 % IV SOLN
INTRAVENOUS | Status: DC
  Administered 2013-10-16: 20 mL/h via INTRAVENOUS

## 2013-10-15 MED ORDER — FUROSEMIDE 10 MG/ML IJ SOLN: 80.0000 mg | Freq: Every day | INTRAMUSCULAR | Status: DC

## 2013-10-15 MED ORDER — BIOTENE DRY MOUTH MT LIQD
15.0000 mL | Freq: Two times a day (BID) | OROMUCOSAL | Status: DC
Start: 2013-10-15 — End: 2013-10-22
  Administered 2013-10-15 – 2013-10-22 (×13): 15 mL via OROMUCOSAL

## 2013-10-15 MED ORDER — ATORVASTATIN CALCIUM 20 MG PO TABS
20.0000 mg | ORAL_TABLET | Freq: Every day | ORAL | Status: DC
  Administered 2013-10-15 – 2013-10-21 (×7): 20 mg via ORAL
  Filled 2013-10-15 (×8): qty 1

## 2013-10-15 MED ORDER — ASPIRIN 81 MG PO CHEW
324.0000 mg | CHEWABLE_TABLET | ORAL | Status: AC
  Administered 2013-10-16: 324 mg via ORAL
  Filled 2013-10-15: qty 4

## 2013-10-15 NOTE — H&P (Signed)
Patient seen and examined with Dr. Redmond Pulling and Dr. Algis Liming. 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 Patient was discharged home on antibiotics for the PNA. At time of discharge patient felt well except for some mild DOE. Over the next 2 days patient was noted to have a poor appetite and progressively worsening SOB and returned for further eval. In ED patient was hound to have hypoxemic resp failure and was started on BIPAP. He was also found to have an NSTEMI with a troponin of 1.54. No CP, no abd pain, no fevers/chills. Pt states that SOB has improved today. Remaining ROS negative  Cardio- RRR, normal heart sounds Pulm-  R sided crackles + Abd- soft, non tender, non distended, bowel sounds + Ext- No pedal edema Gen- AAO*3, occasionally forgetful  Assessment and Plan: I have reviewed Dr. Jodene Nam note and agree with the documentation as outlined with the following additions:  Acute hypoxemic respiratory failure - secondary to acute bacterial PNA. C/w zosyn, vanco for now - Started on azithromycin for atypical coverage - will monitor O2 sats. Attempt to wean O2 to nasal cannula today - Given lasix * 1 secondary to likely volume overload as well - Steroids dc'd. Will monitor off steroids - Pulm recommendations appreciated. Bronch deferred at this time - CTA with no evidence of PE  Acute NSTEMI - Cardio recommendations appreciated.  - Pt scheduled for cardiac cath in AM. In the setting of CKD and recent contrast for CTA would consider deferring cath till patient more medically stable - Pt also with recent GI bleed and may not be a good candidate for dual anti platelet therapy if stent is required  AKI on CKD -  Mildly increased creatinine today likely secondary to hypotension and contrast. - Will monitor - Avoid IVF in setting of likely acute on  chronic combined CHF - if worsening will consider further work up  Atrial fibrillation - Currently off a/c given recent GI bleed - Will resume a/c in 1 week if Hg remains stable  Acute on chronic combined CHF - No further lasix today as patient may go for cath in AM - Strict I's and O's, daiy weights - Will resume lasix in AM if Cr stable and patient not going for cath  Case d/w patient in detail

## 2013-10-15 NOTE — ED Notes (Signed)
O2 changed from venturi-mask to [place] at 6l/m/[place] with humidification-- per dr's orders.

## 2013-10-15 NOTE — ED Notes (Addendum)
Wesley Wang (son)- [phone] Wants to be notified when pt gets a bed.

## 2013-10-15 NOTE — ED Notes (Signed)
Cardiology at bedside.

## 2013-10-15 NOTE — ED Notes (Signed)
Daughter in law-- theresa-- notified of admission.

## 2013-10-15 NOTE — Progress Notes (Signed)
Subjective: Wesley Wang was seen and examined this AM.  He is still in ED because there are no SDU beds or ICU overflow beds available in the hospital.  His breathing is improved, no longer requiring BiPAP but is on 15L via venti mask.  Objective: Vital signs in last 24 hours: Filed Vitals:   10/15/13 1130 10/15/13 1145 10/15/13 1250 10/15/13 1252  BP: 111/71 121/60    Pulse: 106     Temp:   97.7 F (36.5 C)   TempSrc:   Oral   Resp: 27 26    Height:    5\' 7"  (1.702 m)  Weight:    173 lb 4.5 oz (78.6 kg)  SpO2: 82%      Weight change:   Intake/Output Summary (Last 24 hours) at 10/15/13 1500 Last data filed at 10/15/13 1419  Gross per 24 hour  Intake      1 ml  Output   1975 ml  Net  -1974 ml   General: resting in bed in NAD HEENT: no gross abnormality Cardiac: RRR, no rubs, murmurs or gallops Pulm: crackles on right, clear on left; no accessory muscle use; no respiratory distress Abd: soft, nontender, nondistended, BS present Ext: warm and well perfused, no pedal edema Neuro: alert and oriented X3 with some short-term memory deficits, responding appropriately  Lab Results: Basic Metabolic Panel:  Recent Labs Lab 10/10/13 0323 10/04/2013 1659 10/19/2013 2252 10/15/13 0315  NA 144 143  --  143  K 3.7 4.5  --  4.1  CL 108 105  --  102  CO2 22 19  --  24  GLUCOSE 95 132*  --  146*  BUN 17 20  --  21  CREATININE 1.20 1.21  --  1.40*  CALCIUM 7.9* 8.5  --  8.5  MG 1.9  --  2.1  --    CBC:  Recent Labs Lab 10/05/2013 1659 10/15/13 0315  WBC 21.2* 16.3*  NEUTROABS 17.9*  --   HGB 12.0* 11.5*  HCT 38.7* 36.5*  MCV 93.9 93.4  PLT 291 249   Cardiac Enzymes:  Recent Labs Lab 10/21/2013 2252 10/15/13 0315 10/15/13 1042  TROPONINI 2.15* 1.82* 1.39*   Micro Results: Recent Results (from the past 240 hour(s))  MRSA PCR SCREENING     Status: None   Collection Time    10/08/13  7:23 PM      Result Value Ref Range Status   MRSA by PCR NEGATIVE  NEGATIVE Final    Comment:            The GeneXpert MRSA Assay (FDA     approved for NASAL specimens     only), is one component of a     comprehensive MRSA colonization     surveillance program. It is not     intended to diagnose MRSA     infection nor to guide or     monitor treatment for     MRSA infections.  CULTURE, BLOOD (ROUTINE X 2)     Status: None   Collection Time    10/29/2013  6:00 PM      Result Value Ref Range Status   Specimen Description BLOOD LEFT FOREARM   Final   Special Requests BOTTLES DRAWN AEROBIC AND ANAEROBIC [facility]   Final   Culture  Setup Time     Final   Value: 10/09/2013 22:59     Performed at [facility]  Final   Value:        BLOOD CULTURE RECEIVED NO GROWTH TO DATE CULTURE WILL BE HELD FOR 5 DAYS BEFORE ISSUING A FINAL NEGATIVE REPORT     Performed at [facility]   Report Status PENDING   Incomplete  CULTURE, BLOOD (ROUTINE X 2)     Status: None   Collection Time    10/13/2013  6:10 PM      Result Value Ref Range Status   Specimen Description BLOOD ARM RIGHT   Final   Special Requests BOTTLES DRAWN AEROBIC AND ANAEROBIC 5CC   Final   Culture  Setup Time     Final   Value: 10/04/2013 22:59     Performed at [facility]   Culture     Final   Value:        BLOOD CULTURE RECEIVED NO GROWTH TO DATE CULTURE WILL BE HELD FOR 5 DAYS BEFORE ISSUING A FINAL NEGATIVE REPORT     Performed at [facility]   Report Status PENDING   Incomplete   Studies/Results: Dg Chest 2 View  10/15/2013   CLINICAL DATA:  Shortness of breath, respiratory failure  EXAM: CHEST  2 VIEW  COMPARISON:  10/26/2013.  FINDINGS: There is patchy infiltrate/ pneumonia in right lung. Small right pleural effusion. Left lung is clear. Minimal degenerative changes thoracic spine. No pulmonary edema.  IMPRESSION: Patchy infiltrate/ pneumonia in right lung. Small right pleural effusion. No pulmonary edema.   Electronically Signed   By: Lahoma Crocker M.D.   On: 10/15/2013  08:04   Ct Angio Chest Pe W/cm &/or Wo Cm  10/08/2013   CLINICAL DATA:  Shortness of breath  EXAM: CT ANGIOGRAPHY CHEST WITH CONTRAST  TECHNIQUE: Multidetector CT imaging of the chest was performed using the standard protocol during bolus administration of intravenous contrast. Multiplanar CT image reconstructions and MIPs were obtained to evaluate the vascular anatomy.  CONTRAST:  63mL OMNIPAQUE IOHEXOL 350 MG/ML SOLN  COMPARISON:  None.  FINDINGS: The lungs are well aerated bilaterally. Diffuse emphysematous changes are seen. There are changes predominately within the right lower lobe as well as within the right upper lobe to a lesser degree consistent with bronchiectasis. Some of these changes are chronic in nature is seen on prior plain film examination. There is likely a degree of acute on chronic infiltrate. A small right-sided pleural effusion is noted. Some mild changes are noted on the left likely of a chronic nature as well.  Calcification of thoracic aorta is noted. The opacification is poor. Pulmonary artery demonstrates a normal branching pattern. No definitive filling defects to suggest pulmonary emboli are identified. No significant hilar or mediastinal adenopathy is seen. Heavy coronary calcifications are noted.  Scanning into the upper abdomen reveals no acute abnormality. The bony structures are within normal limits.  Review of the MIP images confirms the above findings.  IMPRESSION: Acute on chronic infiltrate in the right lung with associated right-sided effusion.  Mild similar chronic changes are noted scattered throughout the left lung.  Changes of bronchiectasis are noted bilaterally.  No evidence of pulmonary emboli is seen.   Electronically Signed   By: Inez Catalina M.D.   On: 10/03/2013 20:54   Dg Chest Portable 1 View  10/19/2013   CLINICAL DATA:  Shortness of breath  EXAM: PORTABLE CHEST - 1 VIEW  COMPARISON:  10/09/2013  FINDINGS: Stable diffuse ill-defined airspace process  throughout the right lung concerning for right lung pneumonia. No developing  effusion or pneumothorax. Left lung remains clear. Minor left base atelectasis. Stable left midlung calcified granuloma. Borderline heart size without superimposed edema or CHF. Atherosclerosis noted of the aorta. Trachea is midline.  IMPRESSION: Similar diffuse right lung airspace process compatible with pneumonia.   Electronically Signed   By: Daryll Brod M.D.   On: 10/02/2013 17:40   Medications: I have reviewed the patient's current medications. Scheduled Meds: . amiodarone  200 mg Oral QPM  . aspirin EC  81 mg Oral Daily  . atorvastatin  20 mg Oral q1800  . azithromycin  500 mg Intravenous Q24H  . citalopram  20 mg Oral QPM  . heparin  5,000 Units Subcutaneous 3 times per day  . levothyroxine  50 mcg Oral QAC breakfast  . pantoprazole  40 mg Oral Daily  . piperacillin-tazobactam (ZOSYN)  IV  3.375 g Intravenous 3 times per day  . vancomycin  750 mg Intravenous Q12H   Continuous Infusions: none PRN Meds:.albuterol  Assessment/Plan: Acute hypoxemic respiratory failure 2/2 to HCAP:  PO2 43.0 at admission and patient required BiPAP.  Respiratory failure 2/2 R pneumonia in the setting of acute HF exacerbation (elevated proBNP, crackles on exam).  CTA negative for PE.  PCCM following and recommendations appreciated.  - continuous pulse ox monitoring in SDU  - attempt to wean to nasal cannula; resume BiPAP if necessary - will treat as HCAP given recent hospitalization and lack of response to outpatient Levaquin - continue vancomycin, zosyn; add zithromycin for atypical coverage - trend PCT - lasix 80mg  IV then d/c (in the setting of AKI and potential cath tomorrow)  NSTEMI:  Troponin 1.54 --> 2.15--> 1.82 --> 1.39.  No CP.  Likely demand in the setting of hypoxic respiratory failure and severe sepsis.  Cardiology consulted and recommendations appreciated.  Plans for cath tomorrow if renal function improved in the  AM.  Spoke with Cardiology regarding concerns for second dye load (patient also received contrast for CTA) and Cr 1.21-->1.40.  Unable to give fluids in the setting of HF exacerbation.     - monitor on telemetry in SDU - AM BMP - consider mucomyst if patient does go to cath  Severe sepsis:  Leukocytosis (21.2 --> 16.3), tachypnea, lung source (HCAP) and lactic acidosis (4.04).  BP low but stable, mostly 90s- 110s SBP.   - treating HCAP as above - trend lactic acid - no fluids in the setting of acute HF exacerbation  Acute on chronic combined HF: crackles, elevated proBNP.   - received 1 dose of Lasix today; will hold further doses as patient may go to cath lab tomorrow - strict I&Os, daily weights  AKI on CKD3:  Baseline Cr 1.3.  Admission Cr 1.21 --> 1.40.  Likely secondary to decreased po, low BP and contrast (for CTA). - monitor BMP - no fluids in the setting of acute HF exacerbation  Atrial fibrillation:  Xarelto on hold since 10/08/13 in the setting of GI bleed. - continue amiodarone - will likely resume in 1 week if Hgb stable; unless patient gets stented and is started on ASA/Plavix  Hypothyroidism:  continue synthroid  Anemia 2/2 to LGIB:  Patient admitted with GI bleed on 05/07-05/11/15 and found to have several AVMs on colonoscopy which were ablated with APC.  His hgb was 10.4 at d/c (improved from 8.0).  Hgb 12.0 --> 11.5 this admission, no bleeding.   - monitor CBC - continue Protonix  Dispo: Disposition is deferred at this time, awaiting improvement of  current medical problems.  Anticipated discharge in approximately 2-3 day(s).   The patient does have a current PCP Leamon Arnt, MD) and does not need an [facility] hospital follow-up appointment after discharge.  The patient does not know have transportation limitations that hinder transportation to clinic appointments.  .Services Needed at time of discharge: Y = Yes, Blank = No PT:   OT:   RN:   Equipment:   Other:       LOS: 1 day   [name], DO 10/15/2013, 3:00 PM

## 2013-10-15 NOTE — Progress Notes (Signed)
Pt on for cath tomorrow (05/15) as 3 rd case for Dr. Ellyn Hack.

## 2013-10-15 NOTE — Progress Notes (Signed)
Pt. Seen and examined. Agree with the NP/PA-C note as written.  Breathing has improved with diuresis - with recently decreased systolic function and repeat episodes of heart failure, now with troponin elevation - would recommend coronary evaluation. Recommend L/RHC - he had recent GI bleeding, therefore, consideration must be given to therapeutic interventions. Will need to watch creatinine closely as it is rising. BP improved - remains in a-fib.  Continue on amiodarone - ?cardioversion attempt at some point or is he more chronic a-fib.  Pixie Casino, MD, [facility] Attending Cardiologist [place]

## 2013-10-15 NOTE — Progress Notes (Signed)
[facility]  Patient Status: Active (receiving services up to time of hospitalization)  [facility] is providing the following services: PT and OT  If patient discharges after hours, please call [phone].   Wesley Wang 10/15/2013, 3:08 PM

## 2013-10-15 NOTE — Progress Notes (Signed)
Patient Name: Wesley Wang Date of Encounter: 10/15/2013  Principal Problem:   Acute respiratory failure with hypoxia Active Problems:   CAD (coronary artery disease)   Atrial fibrillation   GI bleed   Chronic kidney failure   HLD (hyperlipidemia)   Adult hypothyroidism   Angiodysplasia of colon   Severe sepsis   NSTEMI (non-ST elevated myocardial infarction)   Acute on chronic combined systolic and diastolic heart failure   Acute respiratory failure   HCAP (healthcare-associated pneumonia)   Bronchiectasis    Patient Profile: Wesley Wang is a 80 yo male with PMH significant for Afib, CAD, CKD with recent discharge from [facility] on 10/12/2013 after treatment for acute GIB and CAP who now presents with acute respiratory failure, worsening pneumonia, acute on chronic systolic HF, and NSTEMI.   SUBJECTIVE: Wesley Wang says he feels fine today. He was on BiPAP but maintaining sats on nasal cannula, his breathing is much improved. He denies CP, palpitations.   OBJECTIVE Filed Vitals:   10/15/13 0630 10/15/13 0645 10/15/13 0700 10/15/13 0737  BP: 104/57 115/59 96/57 107/69  Pulse: 74 69 71 75  Temp:      TempSrc:      Resp: 31 29 30 27   SpO2:    93%    Intake/Output Summary (Last 24 hours) at 10/15/13 0854 Last data filed at 10/15/13 0626  Gross per 24 hour  Intake      1 ml  Output    875 ml  Net   -874 ml   There were no vitals filed for this visit.  PHYSICAL EXAM General: Well developed, well nourished, male in no acute distress. Head: Normocephalic, atraumatic.  Neck: Supple without bruits, JVD at 8 cm. Lungs:  Resp regular and slightly labored, rales R lower lobe Heart: Irregularly irregular rhythm, S1, S2, no S3, S4, or murmur; no rub. Abdomen: Soft, non-tender, non-distended, BS + x 4.  Extremities: No clubbing, cyanosis, no edema.  Neuro: Alert and oriented X 3. Moves all extremities spontaneously. Psych: Normal affect.  LABS: CBC: Recent Labs  10/06/2013 1659 10/15/13 0315  WBC 21.2* 16.3*  NEUTROABS 17.9*  --   HGB 12.0* 11.5*  HCT 38.7* 36.5*  MCV 93.9 93.4  PLT 291 249   INR: Recent Labs  10/07/2013 1659  INR 9.37   Basic Metabolic Panel: Recent Labs   1659 11/01/2013 2252 10/15/13 0315  NA 143  --  143  K 4.5  --  4.1  CL 105  --  102  CO2 19  --  24  GLUCOSE 132*  --  146*  BUN 20  --  21  CREATININE 1.21  --  1.40*  CALCIUM 8.5  --  8.5  MG  --  2.1  --    Liver Function Tests: Recent Labs  10/25/2013 1659  AST 40*  ALT 21  ALKPHOS 81  BILITOT 1.5*  PROT 6.7  ALBUMIN 2.7*   Cardiac Enzymes: Recent Labs  10/09/2013 2252 10/15/13 0315  TROPONINI 2.15* 1.82*    Recent Labs  10/21/2013 1733  TROPIPOC 1.54*   BNP: Pro B Natriuretic peptide (BNP)  Date/Time Value Ref Range Status  10/16/2013  5:05 PM 9922.0* 0 - 450 pg/mL Final   Thyroid Function Tests: Recent Labs  10/02/2013 2252  TSH 1.730   TELE:   Atrial fib, rate generally well-controlled    ECG:  10/20/2013 Afib, rate in 80s Prolonged QT interval   Radiology/Studies: Dg Chest 2 View  10/15/2013   CLINICAL DATA:  Shortness of breath, respiratory failure  EXAM: CHEST  2 VIEW  COMPARISON:  .  FINDINGS: There is patchy infiltrate/ pneumonia in right lung. Small right pleural effusion. Left lung is clear. Minimal degenerative changes thoracic spine. No pulmonary edema.  IMPRESSION: Patchy infiltrate/ pneumonia in right lung. Small right pleural effusion. No pulmonary edema.   Electronically Signed   By: Lahoma Crocker M.D.   On: 10/15/2013 08:04   Ct Angio Chest Pe W/cm &/or Wo Cm 10/05/2013   CLINICAL DATA:  Shortness of breath  EXAM: CT ANGIOGRAPHY CHEST WITH CONTRAST  TECHNIQUE: Multidetector CT imaging of the chest was performed using the standard protocol during bolus administration of intravenous contrast. Multiplanar CT image reconstructions and MIPs were obtained to evaluate the vascular anatomy.  CONTRAST:  44mL OMNIPAQUE  IOHEXOL 350 MG/ML SOLN  COMPARISON:  None.  FINDINGS: The lungs are well aerated bilaterally. Diffuse emphysematous changes are seen. There are changes predominately within the right lower lobe as well as within the right upper lobe to a lesser degree consistent with bronchiectasis. Some of these changes are chronic in nature is seen on prior plain film examination. There is likely a degree of acute on chronic infiltrate. A small right-sided pleural effusion is noted. Some mild changes are noted on the left likely of a chronic nature as well.  Calcification of thoracic aorta is noted. The opacification is poor. Pulmonary artery demonstrates a normal branching pattern. No definitive filling defects to suggest pulmonary emboli are identified. No significant hilar or mediastinal adenopathy is seen. Heavy coronary calcifications are noted.  Scanning into the upper abdomen reveals no acute abnormality. The bony structures are within normal limits.  Review of the MIP images confirms the above findings.  IMPRESSION: Acute on chronic infiltrate in the right lung with associated right-sided effusion.  Mild similar chronic changes are noted scattered throughout the left lung.  Changes of bronchiectasis are noted bilaterally.  No evidence of pulmonary emboli is seen.   Electronically Signed   By: Inez Catalina M.D.   On: 10/18/2013 20:54   Dg Chest Portable 1 View 11/01/2013   CLINICAL DATA:  Shortness of breath  EXAM: PORTABLE CHEST - 1 VIEW  COMPARISON:  10/09/2013  FINDINGS: Stable diffuse ill-defined airspace process throughout the right lung concerning for right lung pneumonia. No developing effusion or pneumothorax. Left lung remains clear. Minor left base atelectasis. Stable left midlung calcified granuloma. Borderline heart size without superimposed edema or CHF. Atherosclerosis noted of the aorta. Trachea is midline.  IMPRESSION: Similar diffuse right lung airspace process compatible with pneumonia.   Electronically  Signed   By: Daryll Brod M.D.   On: 10/07/2013 17:40     Current Medications:  . amiodarone  200 mg Oral QPM  . aspirin EC  81 mg Oral Daily  . citalopram  20 mg Oral QPM  . furosemide  80 mg Intravenous Daily  . heparin  5,000 Units Subcutaneous 3 times per day  . levothyroxine  50 mcg Oral QAC breakfast  . pantoprazole  40 mg Oral Daily  . simvastatin  40 mg Oral QPM  . sodium chloride  3 mL Intravenous Q12H  . sodium chloride  3 mL Intravenous Q12H  . sodium chloride  3 mL Intravenous Q12H  . vancomycin  750 mg Intravenous Q12H   . sodium chloride    . sodium chloride    . azithromycin Stopped (10/15/13 0408)  . piperacillin-tazobactam (ZOSYN)  IV  3.375 g (10/15/13 0622)    ASSESSMENT AND PLAN: Principal Problem:   Acute respiratory failure with hypoxia Active Problems:   CAD (coronary artery disease)   Atrial fibrillation   GI bleed   Chronic kidney failure   HLD (hyperlipidemia)   Adult hypothyroidism   Angiodysplasia of colon   Severe sepsis   NSTEMI (non-ST elevated myocardial infarction)   Acute on chronic combined systolic and diastolic heart failure   Acute respiratory failure   HCAP (healthcare-associated pneumonia)   Bronchiectasis   1. NSTEMI: Troponins are beginning to trend down. Patient denies CP, palpitations.  -Draw one more set of troponin -Continue telemetry, EKG prn for chest pain or arrythmia  -Continue ASA 81mg , statin -No anticoagulation as patient had recent GIB last week -Coreg held at the start of his 05/07 admission for hypotension. Continue to hold as SBP is tenuous. Restart when BP will tolerate. - consider echo to recheck EF, was 35% in January 2015 - MD advise on ischemic eval, and its timing  2. Acute on chronic systolic HF: Net since admission -874. Patient appears minimally volume overnloaded exam with no LEE. R lower lobe crackles possibly due to pneumonia.  - Since Cr higher after dye load and respiratory status improved,  hold Lasix today, reassess in am and decide on further diuresis.  3. Atrial fibrillation: Hold anticoagulation due to recent acute GIB. Consider restarting BB if BP will tolerate. This morning BP was 88/57, but slowly improving.  4. Recent LGIB - polypectomy and angiodyplasia on colonoscopy (treated), H&H mildly trending down, follow, per IM.  PNA and other issues listed above, per IM, CCM.  Rondel Baton, PA-S  Signed, Lonn Georgia , PA-C 8:54 AM 10/15/2013

## 2013-10-15 NOTE — Consult Note (Signed)
PULMONARY / CRITICAL CARE MEDICINE  Name: Wesley Wang MRN: [id number] DOB: Oct 23, 1931    ADMISSION DATE:  10/25/2013 CONSULTATION DATE:  07/18/2013  REFERRING MD :  Dareen Piano PRIMARY SERVICE:  [facility]  CHIEF COMPLAINT:  Pneumonia?  BRIEF PATIENT DESCRIPTION: 80 yo recently treated for CAP and discharged 5/11 on Levaquin with residual dyspnea.  Returned on 5/13 with increased dyspnea.  In ED imaging demon started acute on chronic R lung  parenchymal disease, R effusion and bilateral atelectasis.  The patient was hypoxic, requiring BiPAP.  SIGNIFICANT EVENTS / STUDIES:  5/13  Chest CTA >>> No PE, acute on chronic R lung  parenchymal disease, R effusion and bilateral bronchiectasis   LINES / TUBES:  CULTURES:  ANTIBIOTICS: Vancomycin 5/13 >>> Zosyn  5/13 >>> Azithromycin 5/14 >>>  HISTORY OF PRESENT ILLNESS:  80 yo recently treated for CAP and discharged 5/11 on Levaquin with residual dyspnea.  Returned on 5/13 with increased dyspnea.  In ED imaging demon started acute on chronic R lung  parenchymal disease, R effusion and bilateral atelectasis.  The patient was hypoxic, requiring BiPAP. Upon my examination he off BiPAP and reports improved dyspnea.  He also reports intermittent aspiration of food during eating associated with cough.  He denies hemoptysis.  There were no fever or chills.  He denies GI or GU symptoms.  PAST MEDICAL HISTORY :  Past Medical History  Diagnosis Date  . Heart attack 04/1994 & 12/2001    s/p ptca but has not had stents or bypass  . Prostate disorder   . Gallstones     Cholecystectomy 2005  . Cataracts, bilateral   . Hernia   . Atrial fibrillation   . Long term (current) use of anticoagulants     on coumadin  . Memory disturbance     (05/20/13) son & pt state note problems w/keeping a schedule. Can't remember what day of the wk its is or Drs appts. Also can't remember if he has taken his meds sometimes. No problems functionally. Son states memory loss is  progressive & rapid. Short term problems.  . Chronic kidney disease   . Coronary artery disease     PTCA x 2  . Hypothyroidism   . Hyperlipidemia   . COPD (chronic obstructive pulmonary disease)   . Essential tremor   . Abdominal pain 04/22/13    RUQ  . Hodgkin lymphoma 2003   Past Surgical History  Procedure Laterality Date  . Cholecystectomy  2005  . Cataract extraction, bilateral Bilateral 11/2010, 12/2010  . Ptca  04/1994 & 12/2001    x 2   . Hemorrhoid surgery    . Transurethral resection of prostate    . Esophagogastroduodenoscopy N/A 10/09/2013    Procedure: ESOPHAGOGASTRODUODENOSCOPY (EGD);  Surgeon: [name], MD;  Location: [facility] ENDOSCOPY;  Service: Endoscopy;  Laterality: N/A;  . Colonoscopy N/A 10/10/2013    Procedure: COLONOSCOPY;  Surgeon: [name], MD;  Location: [facility] ENDOSCOPY;  Service: Endoscopy;  Laterality: N/A;   Prior to Admission medications   Medication Sig Start Date End Date Taking? Authorizing Provider  amiodarone (PACERONE) 200 MG tablet Take 1 tablet (200 mg total) by mouth every evening. 07/31/13  Yes Thompson Grayer, MD  citalopram (CELEXA) 20 MG tablet Take 20 mg by mouth every evening.  05/18/13  Yes Historical Provider, MD  levofloxacin (LEVAQUIN) 500 MG tablet Take 1 tablet (500 mg total) by mouth daily. 10/12/13  Yes [name], DO  levothyroxine (SYNTHROID, LEVOTHROID) 50 MCG tablet  Take 50 mcg by mouth daily before breakfast.  05/05/13  Yes Historical Provider, MD  Liniments (SALONPAS PAIN RELIEF PATCH) PADS Apply 1 each topically daily as needed (for back pain).   Yes Historical Provider, MD  naproxen sodium (ALEVE) 220 MG tablet Take 220 mg by mouth daily as needed (for pain).   Yes Historical Provider, MD  pantoprazole (PROTONIX) 40 MG tablet Take 1 tablet (40 mg total) by mouth daily. 10/12/13  Yes [name], DO  potassium chloride SA (K-DUR,KLOR-CON) 20 MEQ tablet Take 20 mEq by mouth 2 (two) times daily.   Yes Historical Provider, MD  simvastatin  (ZOCOR) 40 MG tablet Take 40 mg by mouth every evening.  05/06/13  Yes Historical Provider, MD  NITROSTAT 0.4 MG SL tablet Place 0.4 mg under the tongue every 5 (five) minutes as needed for chest pain.  03/24/13   Historical Provider, MD  Rivaroxaban (XARELTO) 15 MG TABS tablet Take 15 mg by mouth daily with supper.    Historical Provider, MD   Allergies  Allergen Reactions  . Sulfa Antibiotics Hives   FAMILY HISTORY:  Family History  Problem Relation Age of Onset  . Heart attack Mother   . Heart disease Mother   . Heart attack Brother   . Cancer Brother   . Heart disease Brother    SOCIAL HISTORY:  reports that he quit smoking about 23 years ago. He does not have any smokeless tobacco history on file. He reports that he does not drink alcohol or use illicit drugs.  REVIEW OF SYSTEMS:   Constitutional: Positive for fatigue. Negative for fever, chills, weight loss, malaise and diaphoresis.  HENT: Negative for hearing loss, ear pain, nosebleeds, congestion, sore throat, neck pain, tinnitus and ear discharge.   Eyes: Negative for blurred vision, double vision, photophobia, pain, discharge and redness.  Respiratory: Positive for cough and dyspnea. Negative for hemoptysis, wheezing and stridor.   Cardiovascular: Negative for chest pain, palpitations, orthopnea, claudication, leg swelling and PND.  Gastrointestinal: positive for intermittent food aspiration. Negative for heartburn, nausea, vomiting, abdominal pain, diarrhea, constipation, blood in stool and melena.  Genitourinary: Negative for dysuria, urgency, frequency, hematuria and flank pain.  Musculoskeletal: Negative for myalgias, back pain, joint pain and falls.  Skin: Negative for itching and rash.  Neurological: Negative for dizziness, tingling, tremors, sensory change, speech change, focal weakness, seizures, loss of consciousness, weakness and headaches.  Endo/Heme/Allergies: Negative for environmental allergies and polydipsia.  Does not bruise/bleed easily.  SUBJECTIVE:   VITAL SIGNS: Temp:  [98.3 F (36.8 C)-100 F (37.8 C)] 100 F (37.8 C) (05/13 1818) Pulse Rate:  [57-108] 86 (05/14 0200) Resp:  [12-40] 30 (05/14 0200) BP: (90-132)/(54-79) 124/70 mmHg (05/14 0200) SpO2:  [76 %-100 %] 92 % (05/14 0200)  PHYSICAL EXAMINATION: General:  No distress, work of breathing somewhat increased Neuro:  Awake, alert, oriented HEENT:  Moist membranes, oxygen mask is on Neck:  No JVD Cardiovascular:  Irregular, nomurmurs Lungs:  Rales over R lung field Abdomen:  Soft, non tender, bowel sounds present Musculoskeletal:  Moves all extremities Skin:  Intact  LABS: CBC  Recent Labs Lab 10/11/13 0350 10/12/13 0654 10/27/2013 1659  WBC 12.0* 11.9* 21.2*  HGB 10.0* 10.4* 12.0*  HCT 31.8* 32.9* 38.7*  PLT 224 250 291   Coag's  Recent Labs Lab 10/09/13 0054 10/12/2013 1659  APTT  --  29  INR 1.61* 1.30   BMET  Recent Labs Lab 10/09/13 0054 10/10/13 0323 11/01/2013 1659  NA 140  144 143  K 4.5 3.7 4.5  CL 109 108 105  CO2 19 22 19   BUN 21 17 20   CREATININE 1.22 1.20 1.21  GLUCOSE 105* 95 132*   Electrolytes  Recent Labs Lab 10/09/13 0054 10/10/13 0323 11/01/2013 1659 10/09/2013 2252  CALCIUM 7.7* 7.9* 8.5  --   MG  --  1.9  --  2.1   Sepsis Markers  Recent Labs Lab 10/08/13 0919 10/26/2013 1735 10/28/2013 2252  LATICACIDVEN 2.14 4.04*  --   PROCALCITON  --   --  0.11   ABG  Recent Labs Lab 10/20/2013 1801 10/17/2013 2307  PHART 7.480* 7.492*  PCO2ART 26.2* 25.7*  PO2ART 43.0* 62.0*   Liver Enzymes  Recent Labs Lab 10/09/13 0054 10/10/13 0323 10/28/2013 1659  AST 14 18 40*  ALT 7 9 21   ALKPHOS 65 63 81  BILITOT 3.1* 1.7* 1.5*  ALBUMIN 2.7* 2.7* 2.7*   Cardiac Enzymes  Recent Labs Lab 10/08/13 2230 10/09/13 0109 10/26/2013 1705 10/27/2013 2252  TROPONINI <0.30 <0.30  --  2.15*  PROBNP  --   --  9922.0*  --    Glucose  Recent Labs Lab 10/08/13 1959  GLUCAP 95    IMAGING: Ct Angio Chest Pe W/cm &/or Wo Cm  10/19/2013   CLINICAL DATA:  Shortness of breath  EXAM: CT ANGIOGRAPHY CHEST WITH CONTRAST  TECHNIQUE: Multidetector CT imaging of the chest was performed using the standard protocol during bolus administration of intravenous contrast. Multiplanar CT image reconstructions and MIPs were obtained to evaluate the vascular anatomy.  CONTRAST:  62mL OMNIPAQUE IOHEXOL 350 MG/ML SOLN  COMPARISON:  None.  FINDINGS: The lungs are well aerated bilaterally. Diffuse emphysematous changes are seen. There are changes predominately within the right lower lobe as well as within the right upper lobe to a lesser degree consistent with bronchiectasis. Some of these changes are chronic in nature is seen on prior plain film examination. There is likely a degree of acute on chronic infiltrate. A small right-sided pleural effusion is noted. Some mild changes are noted on the left likely of a chronic nature as well.  Calcification of thoracic aorta is noted. The opacification is poor. Pulmonary artery demonstrates a normal branching pattern. No definitive filling defects to suggest pulmonary emboli are identified. No significant hilar or mediastinal adenopathy is seen. Heavy coronary calcifications are noted.  Scanning into the upper abdomen reveals no acute abnormality. The bony structures are within normal limits.  Review of the MIP images confirms the above findings.  IMPRESSION: Acute on chronic infiltrate in the right lung with associated right-sided effusion.  Mild similar chronic changes are noted scattered throughout the left lung.  Changes of bronchiectasis are noted bilaterally.  No evidence of pulmonary emboli is seen.   Electronically Signed   By: Inez Catalina M.D.   On: 10/17/2013 20:54   Dg Chest Portable 1 View  10/07/2013   CLINICAL DATA:  Shortness of breath  EXAM: PORTABLE CHEST - 1 VIEW  COMPARISON:  10/09/2013  FINDINGS: Stable diffuse ill-defined airspace process  throughout the right lung concerning for right lung pneumonia. No developing effusion or pneumothorax. Left lung remains clear. Minor left base atelectasis. Stable left midlung calcified granuloma. Borderline heart size without superimposed edema or CHF. Atherosclerosis noted of the aorta. Trachea is midline.  IMPRESSION: Similar diffuse right lung airspace process compatible with pneumonia.   Electronically Signed   By: Daryll Brod M.D.   On: 10/29/2013 17:40   ASSESSMENT / PLAN:  Acute hypoxemic respiratory failure Possible aspiration pneumonia vs HCAP Possible alveolar hemorrhage while on anticoagulation Less likely unilateral pulmonary edema Unilateral pneumonitis ( including Amiodarone related ) is uncommon, unless aspiration related R pleural effusion "COPD" in medical record, but no acute wheezing Bilateral bronchiectasis PE ruled out NSTEMI vs demand ischemia on the background of CAD and increased work of breathing DNI / DNR status    Supplemental oxygen with goal SpO2>92   BiPAP PRN   Agree with Vancomycin / Zosyn   Added Azithromycin for atypical coverage ( was on Levaquin prior to admission)   PCT   SLP evaluation, ordered   Do not see clear indication for systemic steroids ( d/c'd) or bronchodilators ( PRN OK )   Would defer thoracentesis at this point   Agree with ASA, Zocor, Lasix   Would not Heparinize   Consider adding beta-blocker if BP/HR tolerate   Trend troponin    Appreciate the consult, will follow     I have personally obtained history, examined patient, evaluated and interpreted laboratory and imaging results, reviewed medical records, formulated assessment / plan and placed orders.  Doree Fudge, MD Pulmonary and [place] Pager: [phone]  10/15/2013, 2:57 AM

## 2013-10-16 ENCOUNTER — Encounter (HOSPITAL_COMMUNITY): Admission: EM | Disposition: E | Payer: Self-pay | Source: Home / Self Care | Attending: Internal Medicine

## 2013-10-16 DIAGNOSIS — N189 Chronic kidney disease, unspecified: Secondary | ICD-10-CM

## 2013-10-16 DIAGNOSIS — D500 Iron deficiency anemia secondary to blood loss (chronic): Secondary | ICD-10-CM

## 2013-10-16 DIAGNOSIS — I5042 Chronic combined systolic (congestive) and diastolic (congestive) heart failure: Secondary | ICD-10-CM

## 2013-10-16 DIAGNOSIS — N179 Acute kidney failure, unspecified: Secondary | ICD-10-CM

## 2013-10-16 DIAGNOSIS — K922 Gastrointestinal hemorrhage, unspecified: Secondary | ICD-10-CM

## 2013-10-16 HISTORY — PX: LEFT AND RIGHT HEART CATHETERIZATION WITH CORONARY ANGIOGRAM: SHX5449

## 2013-10-16 LAB — BASIC METABOLIC PANEL
BUN: 34 mg/dL — AB (ref 6–23)
CO2: 26 mEq/L (ref 19–32)
CREATININE: 1.55 mg/dL — AB (ref 0.50–1.35)
Calcium: 8 mg/dL — ABNORMAL LOW (ref 8.4–10.5)
Chloride: 96 mEq/L (ref 96–112)
GFR calc non Af Amer: 41 mL/min — ABNORMAL LOW (ref >90)
GFR, EST AFRICAN AMERICAN: 47 mL/min — AB (ref >90)
GLUCOSE: 141 mg/dL — AB (ref 70–99)
Potassium: 4 mEq/L (ref 3.7–5.3)
Sodium: 137 mEq/L (ref 137–147)

## 2013-10-16 LAB — CBC
HEMATOCRIT: 34.4 % — AB (ref 39.0–52.0)
Hemoglobin: 10.4 g/dL — ABNORMAL LOW (ref 13.0–17.0)
MCH: 28.5 pg (ref 26.0–34.0)
MCHC: 30.2 g/dL (ref 30.0–36.0)
MCV: 94.2 fL (ref 78.0–100.0)
Platelets: 268 10*3/uL (ref 150–400)
RBC: 3.65 MIL/uL — ABNORMAL LOW (ref 4.22–5.81)
RDW: 15.9 % — ABNORMAL HIGH (ref 11.5–15.5)
WBC: 18.1 10*3/uL — ABNORMAL HIGH (ref 4.0–10.5)

## 2013-10-16 LAB — PROCALCITONIN: Procalcitonin: 0.12 ng/mL

## 2013-10-16 LAB — LACTIC ACID, PLASMA: Lactic Acid, Venous: 3 mmol/L — ABNORMAL HIGH (ref 0.5–2.2)

## 2013-10-16 SURGERY — LEFT AND RIGHT HEART CATHETERIZATION WITH CORONARY ANGIOGRAM
Anesthesia: LOCAL

## 2013-10-16 MED ORDER — VANCOMYCIN HCL 10 G IV SOLR
1250.0000 mg | INTRAVENOUS | Status: DC
  Administered 2013-10-17 – 2013-10-22 (×6): 1250 mg via INTRAVENOUS
  Filled 2013-10-16 (×7): qty 1250

## 2013-10-16 NOTE — Consult Note (Signed)
PULMONARY / CRITICAL CARE MEDICINE  Name: Wesley Wang MRN: [id number] DOB: [birth year]    ADMISSION DATE:  10/03/2013 CONSULTATION DATE:  07/18/2013  REFERRING MD :  Dareen Piano PRIMARY SERVICE:  [facility]  CHIEF COMPLAINT:  Pneumonia?  BRIEF PATIENT DESCRIPTION: 80 yo recently treated for CAP and discharged 5/11 on Levaquin with residual dyspnea.  Returned on 5/13 with increased dyspnea.  In ED imaging demon started acute on chronic R lung  parenchymal disease, R effusion and bilateral atelectasis.  The patient was hypoxic, requiring BiPAP.  SIGNIFICANT EVENTS / STUDIES:  5/13  Chest CTA >>> No PE, acute on chronic R lung  parenchymal disease, R effusion and bilateral bronchiectasis   LINES / TUBES: PIV  CULTURES: Blood 5/14>>>  ANTIBIOTICS: Vancomycin 5/13 >>> Zosyn  5/13 >>> Azithromycin 5/14 >>>  SUBJECTIVE: No events overnight, no BiPAP overnight.  VITAL SIGNS: Temp:  [97.6 F (36.4 C)-98.9 F (37.2 C)] 98 F (36.7 C) (05/15 0759) Pulse Rate:  [54-106] 67 (05/15 1000) Resp:  [11-31] 11 (05/15 1000) BP: (87-128)/(50-75) 128/64 mmHg (05/15 1000) SpO2:  [82 %-99 %] 91 % (05/15 1000) Weight:  [173 lb 4.5 oz (78.6 kg)-177 lb 4 oz (80.4 kg)] 177 lb 4 oz (80.4 kg) (05/15 0353)  PHYSICAL EXAMINATION: General:  No distress, work of breathing somewhat increased Neuro:  Awake, alert, oriented HEENT:  Moist membranes, oxygen mask is on Neck:  No JVD Cardiovascular:  Irregular, nomurmurs Lungs:  Rales over R lung field Abdomen:  Soft, non tender, bowel sounds present Musculoskeletal:  Moves all extremities Skin:  Intact  LABS: CBC  Recent Labs Lab 10/10/2013 1659 10/15/13 0315 11/01/2013 0254  WBC 21.2* 16.3* 18.1*  HGB 12.0* 11.5* 10.4*  HCT 38.7* 36.5* 34.4*  PLT 291 249 268   Coag's  Recent Labs Lab 10/25/2013 1659  APTT 29  INR 1.30   BMET  Recent Labs Lab 10/11/2013 1659 10/15/13 0315 10/08/2013 0254  NA 143 143 137  K 4.5 4.1 4.0  CL 105 102 96  CO2 19  24 26   BUN 20 21 34*  CREATININE 1.21 1.40* 1.55*  GLUCOSE 132* 146* 141*   Electrolytes  Recent Labs Lab 10/10/13 0323 10/24/2013 1659 10/17/2013 2252 10/15/13 0315 10/02/2013 0254  CALCIUM 7.9* 8.5  --  8.5 8.0*  MG 1.9  --  2.1  --   --    Sepsis Markers  Recent Labs Lab 10/13/2013 1735 10/19/2013 2252 10/15/13 0315 10/17/2013 0254  LATICACIDVEN 4.04*  --   --  3.0*  PROCALCITON  --  0.11 0.13 0.12   ABG  Recent Labs Lab 10/09/2013 1801 10/05/2013 2307  PHART 7.480* 7.492*  PCO2ART 26.2* 25.7*  PO2ART 43.0* 62.0*   Liver Enzymes  Recent Labs Lab 10/10/13 0323 10/08/2013 1659  AST 18 40*  ALT 9 21  ALKPHOS 63 81  BILITOT 1.7* 1.5*  ALBUMIN 2.7* 2.7*   Cardiac Enzymes  Recent Labs Lab 10/28/2013 1705 10/28/2013 2252 10/15/13 0315 10/15/13 1042  TROPONINI  --  2.15* 1.82* 1.39*  PROBNP 9922.0*  --   --   --    Glucose No results found for this basename: GLUCAP,  in the last 168 hours IMAGING: Dg Chest 2 View  10/15/2013   CLINICAL DATA:  Shortness of breath, respiratory failure  EXAM: CHEST  2 VIEW  COMPARISON:  11/01/2013.  FINDINGS: There is patchy infiltrate/ pneumonia in right lung. Small right pleural effusion. Left lung is clear. Minimal degenerative changes thoracic spine. No pulmonary edema.  IMPRESSION: Patchy infiltrate/ pneumonia in right lung. Small right pleural effusion. No pulmonary edema.   Electronically Signed   By: Lahoma Crocker M.D.   On: 10/15/2013 08:04   Ct Angio Chest Pe W/cm &/or Wo Cm  11-09-13   CLINICAL DATA:  Shortness of breath  EXAM: CT ANGIOGRAPHY CHEST WITH CONTRAST  TECHNIQUE: Multidetector CT imaging of the chest was performed using the standard protocol during bolus administration of intravenous contrast. Multiplanar CT image reconstructions and MIPs were obtained to evaluate the vascular anatomy.  CONTRAST:  70mL OMNIPAQUE IOHEXOL 350 MG/ML SOLN  COMPARISON:  None.  FINDINGS: The lungs are well aerated bilaterally. Diffuse  emphysematous changes are seen. There are changes predominately within the right lower lobe as well as within the right upper lobe to a lesser degree consistent with bronchiectasis. Some of these changes are chronic in nature is seen on prior plain film examination. There is likely a degree of acute on chronic infiltrate. A small right-sided pleural effusion is noted. Some mild changes are noted on the left likely of a chronic nature as well.  Calcification of thoracic aorta is noted. The opacification is poor. Pulmonary artery demonstrates a normal branching pattern. No definitive filling defects to suggest pulmonary emboli are identified. No significant hilar or mediastinal adenopathy is seen. Heavy coronary calcifications are noted.  Scanning into the upper abdomen reveals no acute abnormality. The bony structures are within normal limits.  Review of the MIP images confirms the above findings.  IMPRESSION: Acute on chronic infiltrate in the right lung with associated right-sided effusion.  Mild similar chronic changes are noted scattered throughout the left lung.  Changes of bronchiectasis are noted bilaterally.  No evidence of pulmonary emboli is seen.   Electronically Signed   By: Inez Catalina M.D.   On: 11/09/2013 20:54   Dg Chest Portable 1 View  09-Nov-2013   CLINICAL DATA:  Shortness of breath  EXAM: PORTABLE CHEST - 1 VIEW  COMPARISON:  10/09/2013  FINDINGS: Stable diffuse ill-defined airspace process throughout the right lung concerning for right lung pneumonia. No developing effusion or pneumothorax. Left lung remains clear. Minor left base atelectasis. Stable left midlung calcified granuloma. Borderline heart size without superimposed edema or CHF. Atherosclerosis noted of the aorta. Trachea is midline.  IMPRESSION: Similar diffuse right lung airspace process compatible with pneumonia.   Electronically Signed   By: Daryll Brod M.D.   On: 2013-11-09 17:40   ASSESSMENT / PLAN:   Acute hypoxemic  respiratory failure Possible aspiration pneumonia vs HCAP Possible alveolar hemorrhage while on anticoagulation Less likely unilateral pulmonary edema Unilateral pneumonitis ( including Amiodarone related ) is uncommon, unless aspiration related R pleural effusion "COPD" in medical record, but no acute wheezing Bilateral bronchiectasis PE ruled out NSTEMI vs demand ischemia on the background of CAD and increased work of breathing DNI / DNR status    Supplemental oxygen with goal SpO2>92   D/C BiPAP   Agree with Vancomycin/Zosyn/zithromax   SLP evaluation, I do not see a note from speech therapy, suspect patient is chronically aspirating, would like to see results, primary to follow up.   No steroids or bronchodilators   Would defer thoracentesis at this point   Would not give further lasix if cardiology is considering    Would not Heparinize   Consider adding beta-blocker if BP/HR tolerate   PCCM will sign off, please call back if needed.    I have personally obtained history, examined patient, evaluated and interpreted laboratory  and imaging results, reviewed medical records, formulated assessment / plan and placed orders.  Rush Farmer, MD Pulmonary and [place] Pager: [phone]  10/09/2013, 10:09 AM

## 2013-10-16 NOTE — Evaluation (Signed)
Physical Therapy Evaluation Patient Details Name: Wesley Wang MRN: [id number] DOB: 09-12-1931 Today's Date: 11-05-13   History of Present Illness  Pt is an 80 y/o male with recent admission, was d/c'd and readmitted with pneumonia and NSTEMI.   Clinical Impression  Pt admitted with the above. Pt currently with functional limitations due to the deficits listed below (see PT Problem List). At the time of PT eval pt was able to ambulate a short distance and then became hypoxic, with sats dropping into the mid to low 70's on 6L/min supplemental O2. O2 increased until pt was supine and sats in high 80's/low 90's. Pt will benefit from skilled PT to increase their independence and safety with mobility to allow discharge to the venue listed below.      Follow Up Recommendations SNF;Supervision/Assistance - 24 hour    Equipment Recommendations  Rolling walker with 5" wheels    Recommendations for Other Services OT consult     Precautions / Restrictions Precautions Precautions: Fall Precaution Comments: Has had falls in the past 6 months based on chart review Restrictions Weight Bearing Restrictions: No      Mobility  Bed Mobility Overal bed mobility: Needs Assistance Bed Mobility: Supine to Sit     Supine to sit: Supervision     General bed mobility comments: Increased time to achieve EOB. Use of bed rail and supervision for safety.   Transfers Overall transfer level: Needs assistance Equipment used: Rolling walker (2 wheeled) Transfers: Sit to/from Stand Sit to Stand: Min guard         General transfer comment: VC's for hand placement on seated surface for safety.   Ambulation/Gait Ambulation/Gait assistance: Min guard Ambulation Distance (Feet): 60 Feet Assistive device: Rolling walker (2 wheeled) Gait Pattern/deviations: Step-through pattern;Decreased stride length;Narrow base of support;Trunk flexed Gait velocity: Decreased Gait velocity interpretation: Below  normal speed for age/gender General Gait Details: VC's for pursed-lip breathing and to keep rolling walker closer to his body for improved posture and safety.   Stairs            Wheelchair Mobility    Modified Rankin (Stroke Patients Only)       Balance Overall balance assessment: History of Falls;Needs assistance Sitting-balance support: Feet supported;No upper extremity supported Sitting balance-Leahy Scale: Fair     Standing balance support: Bilateral upper extremity supported Standing balance-Leahy Scale: Fair                               Pertinent Vitals/Pain See assessment for O2 sats    Home Living Family/patient expects to be discharged to:: Private residence Living Arrangements: Alone Available Help at Discharge: Family;Available 24 hours/day Type of Home: Apartment Home Access: Level entry     Home Layout: One level Home Equipment: Cane - single point;Shower seat - built in;Grab bars - tub/shower Additional Comments: Occasionally using cane    Prior Function Level of Independence: Independent         Comments: Drives, makes trips out to [employer] or [employer]     Hand Dominance   Dominant Hand: Right    Extremity/Trunk Assessment   Upper Extremity Assessment: Defer to OT evaluation           Lower Extremity Assessment: Overall WFL for tasks assessed      Cervical / Trunk Assessment: Normal  Communication   Communication: No difficulties  Cognition Arousal/Alertness: Awake/alert Behavior During Therapy: WFL for tasks assessed/performed Overall Cognitive  Status: History of cognitive impairments - at baseline       Memory: Decreased short-term memory              General Comments      Exercises        Assessment/Plan    PT Assessment Patient needs continued PT services  PT Diagnosis Difficulty walking   PT Problem List Decreased strength;Decreased range of motion;Decreased activity tolerance;Decreased  balance;Decreased mobility;Decreased knowledge of use of DME;Decreased safety awareness;Decreased knowledge of precautions;Cardiopulmonary status limiting activity  PT Treatment Interventions DME instruction;Gait training;Functional mobility training;Therapeutic activities;Therapeutic exercise;Balance training;Cognitive remediation;Patient/family education   PT Goals (Current goals can be found in the Care Plan section) Acute Rehab PT Goals Patient Stated Goal: get his strength back PT Goal Formulation: With patient Time For Goal Achievement: 11-19-13 Potential to Achieve Goals: Good    Frequency Min 3X/week   Barriers to discharge Decreased caregiver support      Co-evaluation               End of Session Equipment Utilized During Treatment: Gait belt;Oxygen Activity Tolerance: Patient limited by fatigue (SOB) Patient left: in bed;with call bell/phone within reach Nurse Communication: Mobility status         Time: 9233-0076 PT Time Calculation (min): 27 min   Charges:   PT Evaluation $Initial PT Evaluation Tier I: 1 Procedure PT Treatments $Therapeutic Activity: 8-22 mins   PT G CodesJolyn Lent 10/12/2013, 5:09 PM  Jolyn Lent, PT, DPT Acute Rehabilitation Services Pager: [phone]

## 2013-10-16 NOTE — Progress Notes (Addendum)
[place] for Vancomycin and Zosyn Indication: HCAP  Allergies  Allergen Reactions  . Sulfa Antibiotics Hives    Patient Measurements: Height: 5\' 7"  (170.2 cm) Weight: 177 lb 4 oz (80.4 kg) IBW/kg (Calculated) : 66.1  Vital Signs: Temp: 98 F (36.7 C) (05/15 1121) Temp src: Oral (05/15 1121) BP: 91/42 mmHg (05/15 1121) Pulse Rate: 72 (05/15 1121) Intake/Output from previous day: 05/14 0701 - 05/15 0700 In: 777.5 [I.V.:40; IV Piggyback:737.5] Out: 2000 [Urine:2000] Intake/Output from this shift: Total I/O In: 72.5 [I.V.:60; IV Piggyback:12.5] Out: 100 [Urine:100]  Labs:  Recent Labs  10/03/2013 1659 10/15/13 0315 10/13/2013 0254  WBC 21.2* 16.3* 18.1*  HGB 12.0* 11.5* 10.4*  PLT 291 249 268  CREATININE 1.21 1.40* 1.55*   Estimated Creatinine Clearance: 38.6 ml/min (by C-G formula based on Cr of 1.55). No results found for this basename: VANCOTROUGH, Corlis Leak, VANCORANDOM, [place], GENTPEAK, GENTRANDOM, TOBRATROUGH, TOBRAPEAK, TOBRARND, AMIKACINPEAK, AMIKACINTROU, AMIKACIN,  in the last 72 hours   Microbiology: Recent Results (from the past 720 hour(s))  MRSA PCR SCREENING     Status: None   Collection Time    10/08/13  7:23 PM      Result Value Ref Range Status   MRSA by PCR NEGATIVE  NEGATIVE Final   Comment:            The GeneXpert MRSA Assay (FDA     approved for NASAL specimens     only), is one component of a     comprehensive MRSA colonization     surveillance program. It is not     intended to diagnose MRSA     infection nor to guide or     monitor treatment for     MRSA infections.  CULTURE, BLOOD (ROUTINE X 2)     Status: None   Collection Time    10/10/2013  6:00 PM      Result Value Ref Range Status   Specimen Description BLOOD LEFT FOREARM   Final   Special Requests BOTTLES DRAWN AEROBIC AND ANAEROBIC [facility]   Final   Culture  Setup Time     Final   Value: 10/27/2013 22:59     Performed at [facility]   Culture     Final   Value:        BLOOD CULTURE RECEIVED NO GROWTH TO DATE CULTURE WILL BE HELD FOR 5 DAYS BEFORE ISSUING A FINAL NEGATIVE REPORT     Performed at [facility]   Report Status PENDING   Incomplete  CULTURE, BLOOD (ROUTINE X 2)     Status: None   Collection Time    10/02/2013  6:10 PM      Result Value Ref Range Status   Specimen Description BLOOD ARM RIGHT   Final   Special Requests BOTTLES DRAWN AEROBIC AND ANAEROBIC 5CC   Final   Culture  Setup Time     Final   Value: 10/13/2013 22:59     Performed at [facility]   Culture     Final   Value:        BLOOD CULTURE RECEIVED NO GROWTH TO DATE CULTURE WILL BE HELD FOR 5 DAYS BEFORE ISSUING A FINAL NEGATIVE REPORT     Performed at [facility]   Report Status PENDING   Incomplete  MRSA PCR SCREENING     Status: None   Collection Time    10/15/13  4:40 PM  Result Value Ref Range Status   MRSA by PCR NEGATIVE  NEGATIVE Final   Comment:            The GeneXpert MRSA Assay (FDA     approved for NASAL specimens     only), is one component of a     comprehensive MRSA colonization     surveillance program. It is not     intended to diagnose MRSA     infection nor to guide or     monitor treatment for     MRSA infections.   Assessment: 80 y.o. male presents from assisted living with SOB. Recently discharged 5/11 from [facility] (treated for GIB and CAP). Pt was on rocephin/azithromycin 5/7-5/10 then changed to levaquin 5/10-admission. Now on broad spectrum antibiotics (Vanc and Zosyn) for HCAP. CXR still showing infiltrate in right lung. WBC still elevated at 18.1, currently afebrile. SCr has worsened some to a reading of 1.55mg /dL this morning, est CrCl ~35-62mL/min. 5/14 Blood cultures with NGTD.  Goal of Therapy:  Vancomycin trough level 15-20 mcg/ml  Plan:  1. Continue Zosyn 3.375gm IV q8h EI 2. With decrease in renal function, change vancomycin to 1250mg  IV q24h  Starting  5/16 at 0600 as a 750mg  dose was hung in the past 20 minutes. 3. Azithromyin 500mg  IV q24 per MD 4. Will f/u micro data, renal function, pt's clinical condition, trough prn  Abaigeal Moomaw D. Jaevian Shean, PharmD, BCPS Clinical Pharmacist Pager: [phone] 10/21/2013 11:36 AM

## 2013-10-16 NOTE — Progress Notes (Signed)
Subjective: [name] was seen and examined this AM.  He is comfortable, no dyspnea, chest pain or leg pain.    Objective: Vital signs in last 24 hours: Filed Vitals:   10/24/2013 0400 10/24/2013 0500 10/13/2013 0600 10/12/2013 0700  BP: 96/60 91/68 95/59  94/58  Pulse: 68 60 66 63  Temp:      TempSrc:      Resp: 25 26 14 30   Height:      Weight:      SpO2: 93% 92% 97% 99%   Weight change:   Intake/Output Summary (Last 24 hours) at 10/25/2013 0724 Last data filed at 10/21/2013 0700  Gross per 24 hour  Intake  777.5 ml  Output   2000 ml  Net -1222.5 ml   General: resting in bed in NAD HEENT: no gross abnormality Cardiac: RRR, no rubs, murmurs or gallops Pulm: crackles on right (improved from yesterday), clear on left; no accessory muscle use; no respiratory distress Abd: soft, nontender, nondistended, BS present Ext: warm and well perfused, no pedal edema Neuro: alert and oriented X3 with some short-term memory deficits, responding appropriately  Lab Results: Basic Metabolic Panel:  Recent Labs Lab 10/10/13 0323  10/19/2013 2252 10/15/13 0315 10/06/2013 0254  NA 144  < >  --  143 137  K 3.7  < >  --  4.1 4.0  CL 108  < >  --  102 96  CO2 22  < >  --  24 26  GLUCOSE 95  < >  --  146* 141*  BUN 17  < >  --  21 34*  CREATININE 1.20  < >  --  1.40* 1.55*  CALCIUM 7.9*  < >  --  8.5 8.0*  MG 1.9  --  2.1  --   --   < > = values in this interval not displayed. CBC:  Recent Labs Lab 10/27/2013 1659 10/15/13 0315 10/28/2013 0254  WBC 21.2* 16.3* 18.1*  NEUTROABS 17.9*  --   --   HGB 12.0* 11.5* 10.4*  HCT 38.7* 36.5* 34.4*  MCV 93.9 93.4 94.2  PLT 291 249 268   Cardiac Enzymes:  Recent Labs Lab 10/24/2013 2252 10/15/13 0315 10/15/13 1042  TROPONINI 2.15* 1.82* 1.39*   Micro Results: Recent Results (from the past 240 hour(s))  MRSA PCR SCREENING     Status: None   Collection Time    10/08/13  7:23 PM      Result Value Ref Range Status   MRSA by PCR NEGATIVE   NEGATIVE Final   Comment:            The GeneXpert MRSA Assay (FDA     approved for NASAL specimens     only), is one component of a     comprehensive MRSA colonization     surveillance program. It is not     intended to diagnose MRSA     infection nor to guide or     monitor treatment for     MRSA infections.  CULTURE, BLOOD (ROUTINE X 2)     Status: None   Collection Time    10/04/2013  6:00 PM      Result Value Ref Range Status   Specimen Description BLOOD LEFT FOREARM   Final   Special Requests BOTTLES DRAWN AEROBIC AND ANAEROBIC [facility]   Final   Culture  Setup Time     Final   Value: 10/29/2013 22:59     Performed at  [facility]     Final   Value:        BLOOD CULTURE RECEIVED NO GROWTH TO DATE CULTURE WILL BE HELD FOR 5 DAYS BEFORE ISSUING A FINAL NEGATIVE REPORT     Performed at [facility]   Report Status PENDING   Incomplete  CULTURE, BLOOD (ROUTINE X 2)     Status: None   Collection Time    10/26/2013  6:10 PM      Result Value Ref Range Status   Specimen Description BLOOD ARM RIGHT   Final   Special Requests BOTTLES DRAWN AEROBIC AND ANAEROBIC 5CC   Final   Culture  Setup Time     Final   Value: 10/20/2013 22:59     Performed at [facility]   Culture     Final   Value:        BLOOD CULTURE RECEIVED NO GROWTH TO DATE CULTURE WILL BE HELD FOR 5 DAYS BEFORE ISSUING A FINAL NEGATIVE REPORT     Performed at [facility]   Report Status PENDING   Incomplete  MRSA PCR SCREENING     Status: None   Collection Time    10/15/13  4:40 PM      Result Value Ref Range Status   MRSA by PCR NEGATIVE  NEGATIVE Final   Comment:            The GeneXpert MRSA Assay (FDA     approved for NASAL specimens     only), is one component of a     comprehensive MRSA colonization     surveillance program. It is not     intended to diagnose MRSA     infection nor to guide or     monitor treatment for     MRSA infections.   Studies/Results: Dg  Chest 2 View  10/15/2013   CLINICAL DATA:  Shortness of breath, respiratory failure  EXAM: CHEST  2 VIEW  COMPARISON:  10/12/2013.  FINDINGS: There is patchy infiltrate/ pneumonia in right lung. Small right pleural effusion. Left lung is clear. Minimal degenerative changes thoracic spine. No pulmonary edema.  IMPRESSION: Patchy infiltrate/ pneumonia in right lung. Small right pleural effusion. No pulmonary edema.   Electronically Signed   By: Lahoma Crocker M.D.   On: 10/15/2013 08:04   Ct Angio Chest Pe W/cm &/or Wo Cm  10/19/2013   CLINICAL DATA:  Shortness of breath  EXAM: CT ANGIOGRAPHY CHEST WITH CONTRAST  TECHNIQUE: Multidetector CT imaging of the chest was performed using the standard protocol during bolus administration of intravenous contrast. Multiplanar CT image reconstructions and MIPs were obtained to evaluate the vascular anatomy.  CONTRAST:  59mL OMNIPAQUE IOHEXOL 350 MG/ML SOLN  COMPARISON:  None.  FINDINGS: The lungs are well aerated bilaterally. Diffuse emphysematous changes are seen. There are changes predominately within the right lower lobe as well as within the right upper lobe to a lesser degree consistent with bronchiectasis. Some of these changes are chronic in nature is seen on prior plain film examination. There is likely a degree of acute on chronic infiltrate. A small right-sided pleural effusion is noted. Some mild changes are noted on the left likely of a chronic nature as well.  Calcification of thoracic aorta is noted. The opacification is poor. Pulmonary artery demonstrates a normal branching pattern. No definitive filling defects to suggest pulmonary emboli are identified. No significant hilar or mediastinal adenopathy is seen. Heavy coronary calcifications  are noted.  Scanning into the upper abdomen reveals no acute abnormality. The bony structures are within normal limits.  Review of the MIP images confirms the above findings.  IMPRESSION: Acute on chronic infiltrate in the right  lung with associated right-sided effusion.  Mild similar chronic changes are noted scattered throughout the left lung.  Changes of bronchiectasis are noted bilaterally.  No evidence of pulmonary emboli is seen.   Electronically Signed   By: Inez Catalina M.D.   On: 10/21/2013 20:54   Dg Chest Portable 1 View  10/09/2013   CLINICAL DATA:  Shortness of breath  EXAM: PORTABLE CHEST - 1 VIEW  COMPARISON:  10/09/2013  FINDINGS: Stable diffuse ill-defined airspace process throughout the right lung concerning for right lung pneumonia. No developing effusion or pneumothorax. Left lung remains clear. Minor left base atelectasis. Stable left midlung calcified granuloma. Borderline heart size without superimposed edema or CHF. Atherosclerosis noted of the aorta. Trachea is midline.  IMPRESSION: Similar diffuse right lung airspace process compatible with pneumonia.   Electronically Signed   By: Daryll Brod M.D.   On: 10/08/2013 17:40   Medications: I have reviewed the patient's current medications. Scheduled Meds: . amiodarone  200 mg Oral QPM  . antiseptic oral rinse  15 mL Mouth Rinse BID  . aspirin EC  81 mg Oral Daily  . atorvastatin  20 mg Oral q1800  . azithromycin  500 mg Intravenous Q24H  . citalopram  20 mg Oral QPM  . diazepam  5 mg Oral On Call  . heparin  5,000 Units Subcutaneous 3 times per day  . levothyroxine  50 mcg Oral QAC breakfast  . pantoprazole  40 mg Oral Daily  . piperacillin-tazobactam (ZOSYN)  IV  3.375 g Intravenous 3 times per day  . sodium chloride  3 mL Intravenous Q12H  . vancomycin  750 mg Intravenous Q12H   Continuous Infusions: . sodium chloride 20 mL/hr (10/20/2013 0544)  none PRN Meds:.sodium chloride, albuterol, sodium chloride  Assessment/Plan: Acute hypoxemic respiratory failure 2/2 to HCAP:  PO2 43.0 at admission and patient required BiPAP.  Respiratory failure 2/2 R pneumonia in the setting of acute HF exacerbation (elevated proBNP, crackles on exam).  CTA  negative for PE.  PCCM following and recommendations appreciated.  SpO2 mid-90s on 3-4L.   - continuous pulse ox monitoring in SDU  - continue nasal cannula; resume BiPAP if necessary - will treat as HCAP given recent hospitalization and lack of response to outpatient Levaquin - continue vancomycin, zosyn and zithromycin for - trend PCT  NSTEMI:  Troponin 1.54 --> 2.15--> 1.82 --> 1.39.  No CP.  Likely demand in the setting of hypoxic respiratory failure and severe sepsis.  Cardiology consulted and recommendations appreciated.   - monitor on telemetry in SDU - cardiac cath deferred at this time given worsening renal function and etiology less likely acute ishcemia - continue ASA and statin - will check lipid panel and HgbA1c  - plan for [facility] tomorrow, will only consider cath if very high risk  Severe sepsis, resolving:  Leukocytosis (21.2 --> 16.3-->18.1), tachypnea, lung source (HCAP) and lactic acidosis (4.04-->3.0).  BP low but stable, mostly 90s- 110s SBP.   - treating HCAP as above - trend lactic acid - no fluids in the setting of acute HF exacerbation   Acute on chronic combined HF, resolved: crackles, elevated proBNP.  Net negative 2L since admission but weight up 4 pounds.  Received 80mg  IV daily for past two days.   -  No more Lasix at this time as he seems stable and Cr elevated - strict I&Os, daily weights   AKI on CKD3:  Baseline Cr 1.3.  Admission Cr 1.21 --> 1.40--> 1.55.  Likely secondary to decreased po, low BP and contrast (for CTA). - monitor BMP - no fluids in the setting of acute HF exacerbation  Atrial fibrillation:  Xarelto on hold since 10/08/13 in the setting of GI bleed. - continue amiodarone - will resume as outpatient in the next week  Hypothyroidism:  continue synthroid  Anemia 2/2 to LGIB:  Patient admitted with GI bleed on 05/07-05/11/15 and found to have several AVMs on colonoscopy which were ablated with APC.  His hgb was 10.4 at d/c  (improved from 8.0).  Hgb 12.0 --> 11.5--> 10.4 this admission, no bleeding.   - monitor CBC - continue Protonix  Diet: Heart; NPO past midnight for Myoview VTE ppx:  Heparin [place], SCDs Code:  DNR  Dispo: Disposition is deferred at this time, awaiting improvement of current medical problems.  Anticipated discharge in approximately 2-3 day(s).   The patient does have a current PCP Leamon Arnt, MD) and does not need an [facility] hospital follow-up appointment after discharge.  The patient does not know have transportation limitations that hinder transportation to clinic appointments.  .Services Needed at time of discharge: Y = Yes, Blank = No PT:   OT:   RN:   Equipment:   Other:     LOS: 2 days   [name], DO 10/20/2013, 7:24 AM

## 2013-10-16 NOTE — Evaluation (Signed)
Clinical/Bedside Swallow Evaluation Patient Details  Name: Wesley Wang MRN: [id number] Date of Birth: 02/07/1932  Today's Date: 10/04/2013 Time: 1400-1420 SLP Time Calculation (min): 20 min  Past Medical History:  Past Medical History  Diagnosis Date  . Heart attack 04/1994 & 12/2001    s/p ptca but has not had stents or bypass  . Prostate disorder   . Gallstones     Cholecystectomy 2005  . Cataracts, bilateral   . Hernia   . Atrial fibrillation   . Long term (current) use of anticoagulants     on coumadin  . Memory disturbance     (05/20/13) son & pt state note problems w/keeping a schedule. Can't remember what day of the wk its is or Drs appts. Also can't remember if he has taken his meds sometimes. No problems functionally. Son states memory loss is progressive & rapid. Short term problems.  . Chronic kidney disease   . Coronary artery disease     PTCA x 2  . Hypothyroidism   . Hyperlipidemia   . COPD (chronic obstructive pulmonary disease)   . Essential tremor   . Abdominal pain 04/22/13    RUQ  . Hodgkin lymphoma 2003  . Dysrhythmia     ATRIAL FIBRILATION   Past Surgical History:  Past Surgical History  Procedure Laterality Date  . Cholecystectomy  2005  . Cataract extraction, bilateral Bilateral 11/2010, 12/2010  . Ptca  04/1994 & 12/2001    x 2   . Hemorrhoid surgery    . Transurethral resection of prostate    . Esophagogastroduodenoscopy N/A 10/09/2013    Procedure: ESOPHAGOGASTRODUODENOSCOPY (EGD);  Surgeon: [name], MD;  Location[facility] ENDOSCOPY;  Service: Endoscopy;  Laterality: N/A;  . Colonoscopy N/A 10/10/2013    Procedure: COLONOSCOPY;  Surgeon: [name], MD;  Location: [facility] ENDOSCOPY;  Service: Endoscopy;  Laterality: N/A;   HPI:  Pt is a very pleasant 80 yo recently treated for CAP and discharged 5/11 on Levaquin with residual dyspnea. Returned on 5/13 with increased dyspnea. In ED imaging showed acute on chronic R lung parenchymal disease, R  effusion and bilateral atelectasis. The patient was hypoxic, requiring BiPAP. MD suspects chronic aspiration.    Assessment / Plan / Recommendation Clinical Impression  Pt demonstrates immediate cough following large sips of thin liquids. Cough eliminated with instruction for small single sips. Despite this, objective study recommended based on consensus that pt has dx of aspiration pna. Pt agreeable to MBS tomorrow to "get to the bottom of things." Pt may continue regular diet with precautions this evening, but will be NPO after midnight for myoview. SLP to f/u tomorrow to schedule MBS well after his myoview if possible.     Aspiration Risk  Moderate    Diet Recommendation Regular;Thin liquid   Liquid Administration via: Cup;No straw Medication Administration: Whole meds with liquid Supervision: Patient able to self feed Compensations: Slow rate;Small sips/bites Postural Changes and/or Swallow Maneuvers: Seated upright 90 degrees    Other  Recommendations Recommended Consults: MBS Oral Care Recommendations: Oral care BID   Follow Up Recommendations   (TBD)    Frequency and Duration        Pertinent Vitals/Pain NA    SLP Swallow Goals     Swallow Study Prior Functional Status       General HPI: Pt is a very pleasant 79 yo recently treated for CAP and discharged 5/11 on Levaquin with residual dyspnea. Returned on 5/13 with increased dyspnea. In ED  imaging showed acute on chronic R lung parenchymal disease, R effusion and bilateral atelectasis. The patient was hypoxic, requiring BiPAP. MD suspects chronic aspiration.  Type of Study: Bedside swallow evaluation Diet Prior to this Study: Regular;Thin liquids Temperature Spikes Noted: No Respiratory Status: Room air History of Recent Intubation: No Behavior/Cognition: Alert;Cooperative;Pleasant mood Oral Cavity - Dentition: Dentures, top;Dentures, bottom Self-Feeding Abilities: Able to feed self Patient Positioning: Upright in  chair Baseline Vocal Quality: Clear Volitional Cough: Strong Volitional Swallow: Able to elicit    Oral/Motor/Sensory Function Overall Oral Motor/Sensory Function: Appears within functional limits for tasks assessed   Ice Chips     Thin Liquid Thin Liquid: Impaired Presentation: Cup;Self Fed Pharyngeal  Phase Impairments: Cough - Immediate    Nectar Thick Nectar Thick Liquid: Not tested   Honey Thick Honey Thick Liquid: Not tested   Puree Puree: Within functional limits   Solid   GO    Solid: Within functional limits      [facility], [place] CCC-SLP [place] Jamie Belger 10/05/2013,2:26 PM

## 2013-10-16 NOTE — Discharge Summary (Signed)
INTERNAL MEDICINE ATTENDING DISCHARGE COSIGN   I evaluated the patient on the day of discharge and discussed the discharge plan with my resident team. I agree with the discharge documentation and disposition.   Karmelo Bass 10/24/2013, 11:18 AM

## 2013-10-16 NOTE — Progress Notes (Signed)
Patient seen and examined with Dr. Redmond Pulling and Dr. Algis Liming. Patient feels better today, Decreased SOB. No abd pain, no fevers.   Cardio- RRR, normal heart sounds  Pulm- R sided crackles + but improving  Abd- soft, non tender, non distended, bowel sounds +  Ext- No pedal edema  Gen- AAO*3, occasionally forgetful   Assessment and Plan:  Case d/w Dr. Redmond Pulling in detail   Acute hypoxemic respiratory failure  - secondary to acute bacterial PNA. C/w zosyn, azithromycin and vanco for now  - Patient with probable aspiration PNA. Would get SLP eval  - Pulm recommendations appreciated  - will monitor O2 sats and continue with O2 via [place]  - Hold off on further lasix for now - Steroids dc'd. Will monitor off steroids    Acute NSTEMI  - Cardio recommendations appreciated.  - cardiac enzymes trending down. Likely demand ischemia from acute hypoxemic resp failure - If BP remains stable today would attempt to restart low dose beta blocker - continue asa, statin. - Cath deferred for now secondary to worsening renal failure Pt for lexiscan myoview in AM for risk stratification  AKI on CKD  - Mildly increased creatinine today likely secondary to hypotension and contrast.  - Will monitor dailu  - Avoid IVF in setting of likely acute on chronic combined CHF  - if worsening will consider further work up   Atrial fibrillation  - Currently off a/c given recent GI bleed  - Will resume a/c in 1 week if Hg remains stable   Acute on chronic combined CHF  - No further lasix for now given AKI on CKD - Strict I's and O's, daiy weights   - will attempt to start low dose coreg in AM if BP remains stable

## 2013-10-16 NOTE — Progress Notes (Signed)
Clinical Social Work Department BRIEF PSYCHOSOCIAL ASSESSMENT 10/27/2013  Patient:  Wesley Wang, Wesley Wang     Account Number:  [id number]     Admit date:  19-Oct-2013  Clinical Social Worker:  Freeman Caldron  Date/Time:  10/06/2013 04:16 PM  Referred by:  Physician  Date Referred:  10/14/2013 Referred for  Other - See comment   Other Referral:   Pt from [facility] type:  Family Other interview type:    PSYCHOSOCIAL DATA Living Status:  ALONE Admitted from facility:   Level of care:   Primary support name:  Ardith Lewman III [phone]) Primary support relationship to patient:  CHILD, ADULT Degree of support available:   Good--pt lives in independent living and has support from son    CURRENT CONCERNS Current Concerns  Post-Acute Placement   Other Concerns:    SOCIAL WORK ASSESSMENT / PLAN PT/OT have not yet seen pt, but MD note states pt is from ALF. Called pt's son, who states pt is from [employer] [facility]. Active with [facility]. RNCM aware. CSW following in event pt needs higher level of care at discharge.   Assessment/plan status:  Psychosocial Support/Ongoing Assessment of Needs Other assessment/ plan:   Information/referral to community resources:   ?Independent Living ?High level of care    PATIENT'S/FAMILY'S RESPONSE TO PLAN OF CARE: Good--son thanked CSW for call.       Ky Barban, MSW, [facility] Clinical Social Worker [phone]

## 2013-10-16 NOTE — Progress Notes (Signed)
Subjective:   Mr. 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 Hospitalized last week for CAP and GIB (xarelto held due to colonic AVMs on endoscopy). Readmitted on 5/13 for worsening respiratory status with CAP (with possible HF component) Cardiology consulted for elevated troponin (peak trop 2.15). Last cath in [place] > 10 years.   Overnight feeling better. Now on nasal canula.  CEs trending down. No CP. Has diuresed about 2L though weight is up.  CR 1.2-> 1.4 -> 1.55 (had chest CT on admit)    Intake/Output Summary (Last 24 hours) at 10/21/2013 0954 Last data filed at 10/21/13 0900  Gross per 24 hour  Intake    830 ml  Output   2100 ml  Net  -1270 ml   Current meds: . amiodarone  200 mg Oral QPM  . antiseptic oral rinse  15 mL Mouth Rinse BID  . aspirin EC  81 mg Oral Daily  . atorvastatin  20 mg Oral q1800  . azithromycin  500 mg Intravenous Q24H  . citalopram  20 mg Oral QPM  . diazepam  5 mg Oral On Call  . heparin  5,000 Units Subcutaneous 3 times per day  . levothyroxine  50 mcg Oral QAC breakfast  . pantoprazole  40 mg Oral Daily  . piperacillin-tazobactam (ZOSYN)  IV  3.375 g Intravenous 3 times per day  . sodium chloride  3 mL Intravenous Q12H  . vancomycin  750 mg Intravenous Q12H   Infusions: . sodium chloride 20 mL/hr (21-Oct-2013 0544)   Objective:  Blood pressure 102/70, pulse 72, temperature 98 F (36.7 C), temperature source Oral, resp. rate 22, height 5\' 7"  (1.702 m), weight 177 lb 4 oz (80.4 kg), SpO2 90.00%. Weight change:   Physical Exam: General: Well appearing. NAD, lying in bed, on 4L [place]  HEENT: EOMI  Neck: supple.  Cor: Irregularly irregular. No murmur or s3 Lungs: R rales, good inspiratory effort, and breath sounds throughout lungs, mild expiratory wheeze upper lobes Abdomen: soft, nontender, distended. +bs  Extremities: no cyanosis,  edema, or tenderness. Moving all 4 extremities without assitance  Neuro: alert & oriented to person and place but did not know the correct month, moves all 4 extremities w/o difficulty. Affect very pleasant  Lab Results: Basic Metabolic Panel:  Recent Labs Lab 10/10/13 0323 11/01/2013 1659 10/29/2013 2252 10/15/13 0315 2013-10-21 0254  NA 144 143  --  143 137  K 3.7 4.5  --  4.1 4.0  CL 108 105  --  102 96  CO2 22 19  --  24 26  GLUCOSE 95 132*  --  146* 141*  BUN 17 20  --  21 34*  CREATININE 1.20 1.21  --  1.40* 1.55*  CALCIUM 7.9* 8.5  --  8.5 8.0*  MG 1.9  --  2.1  --   --    Liver Function Tests:  Recent Labs Lab 10/10/13 0323 10/17/2013 1659  AST 18 40*  ALT 9 21  ALKPHOS 63 81  BILITOT 1.7* 1.5*  PROT 5.7* 6.7  ALBUMIN 2.7* 2.7*   CBC:  Recent Labs Lab 10/11/13 0350 10/12/13 0654 10/27/2013 1659 10/15/13 0315 10/21/13 0254  WBC 12.0* 11.9* 21.2* 16.3* 18.1*  NEUTROABS  --   --  17.9*  --   --   HGB 10.0* 10.4* 12.0* 11.5* 10.4*  HCT 31.8* 32.9* 38.7*  36.5* 34.4*  MCV 94.1 93.2 93.9 93.4 94.2  PLT 224 250 291 249 268   Cardiac Enzymes:  Recent Labs Lab 10/04/2013 2252 10/15/13 0315 10/15/13 1042  TROPONINI 2.15* 1.82* 1.39*   Microbiology: Lab Results  Component Value Date   CULT  Value:        BLOOD CULTURE RECEIVED NO GROWTH TO DATE CULTURE WILL BE HELD FOR 5 DAYS BEFORE ISSUING A FINAL NEGATIVE REPORT Performed at [facility] 10/25/2013   CULT  Value:        BLOOD CULTURE RECEIVED NO GROWTH TO DATE CULTURE WILL BE HELD FOR 5 DAYS BEFORE ISSUING A FINAL NEGATIVE REPORT Performed at [facility] 10/21/2013   CULT Multiple bacterial morphotypes present, none predominant. Suggest appropriate recollection if clinically indicated. 06/18/2010    Recent Labs Lab 10/20/2013 1800 10/13/2013 1810  CULT        BLOOD CULTURE RECEIVED NO GROWTH TO DATE CULTURE WILL BE HELD FOR 5 DAYS BEFORE ISSUING A FINAL NEGATIVE REPORT Performed at [place] TO DATE CULTURE WILL BE HELD FOR 5 DAYS BEFORE ISSUING A FINAL NEGATIVE REPORT Performed at [place] BLOOD ARM RIGHT   Imaging: Dg Chest 2 View  10/15/2013   CLINICAL DATA:  Shortness of breath, respiratory failure  EXAM: CHEST  2 VIEW  COMPARISON:  10/28/2013.  FINDINGS: There is patchy infiltrate/ pneumonia in right lung. Small right pleural effusion. Left lung is clear. Minimal degenerative changes thoracic spine. No pulmonary edema.  IMPRESSION: Patchy infiltrate/ pneumonia in right lung. Small right pleural effusion. No pulmonary edema.   Electronically Signed   By: Lahoma Crocker M.D.   On: 10/15/2013 08:04   Ct Angio Chest Pe W/cm &/or Wo Cm  10/21/2013   CLINICAL DATA:  Shortness of breath  EXAM: CT ANGIOGRAPHY CHEST WITH CONTRAST  TECHNIQUE: Multidetector CT imaging of the chest was performed using the standard protocol during bolus administration of intravenous contrast. Multiplanar CT image reconstructions and MIPs were obtained to evaluate the vascular anatomy.  CONTRAST:  40mL OMNIPAQUE IOHEXOL 350 MG/ML SOLN  COMPARISON:  None.  FINDINGS: The lungs are well aerated bilaterally. Diffuse emphysematous changes are seen. There are changes predominately within the right lower lobe as well as within the right upper lobe to a lesser degree consistent with bronchiectasis. Some of these changes are chronic in nature is seen on prior plain film examination. There is likely a degree of acute on chronic infiltrate. A small right-sided pleural effusion is noted. Some mild changes are noted on the left likely of a chronic nature as well.  Calcification of thoracic aorta is noted. The opacification is poor. Pulmonary artery demonstrates a normal branching pattern. No definitive filling defects to suggest pulmonary emboli are identified. No significant hilar or mediastinal adenopathy is seen. Heavy coronary calcifications are  noted.  Scanning into the upper abdomen reveals no acute abnormality. The bony structures are within normal limits.  Review of the MIP images confirms the above findings.  IMPRESSION: Acute on chronic infiltrate in the right lung with associated right-sided effusion.  Mild similar chronic changes are noted scattered throughout the left lung.  Changes of bronchiectasis are noted bilaterally.  No evidence of pulmonary emboli is seen.   Electronically Signed   By: Inez Catalina M.D.   On: 10/21/2013 20:54   Dg Chest Portable 1 View  10/20/2013  CLINICAL DATA:  Shortness of breath  EXAM: PORTABLE CHEST - 1 VIEW  COMPARISON:  10/09/2013  FINDINGS: Stable diffuse ill-defined airspace process throughout the right lung concerning for right lung pneumonia. No developing effusion or pneumothorax. Left lung remains clear. Minor left base atelectasis. Stable left midlung calcified granuloma. Borderline heart size without superimposed edema or CHF. Atherosclerosis noted of the aorta. Trachea is midline.  IMPRESSION: Similar diffuse right lung airspace process compatible with pneumonia.   Electronically Signed   By: Daryll Brod M.D.   On: 10/15/2013 17:40   ASSESSMENT/PLAN:   NSTEMI--likely demand in setting of acute respiratory failure 2/2 PNA. Cardiac enzymes trended down to 1.39 with peak at 2.15 on admission. No chest pain. Initially recommended for possible cath today.  -currently on ASA with caution for bleeding -continue amio -would try to restart BB if BP can tolerate -continue statin, currently on 20mg  lipitor -would defer cath at this time given worsening renal function and less likely acute ischemic etiology -TSH wnl, consider checking A1C and Lipid panel for further risk stratification  Acute respiratory failure 2/2 CAP with resolving sepsis in setting of failure of recent outpatient therapy with Levofloxacin. Initially on BiPAP now on [place] 4L with improved respiratory status. R sided infiltrates on CXR  with uptrending leukocytosis. Afebrile.  -continue vanc (caution with renal function), zosyn, and azithromycin -wean oxygen as tolerated -incentive spirometry -duoneb prn  Combined systolic and diastolic heart failure--EF 35% per echo 06/2013 with grade 2 diastolic dysfunction, LVEDP at least 19mmHg, with moderate diffuse hypokinesis. Prior home medications include: ASA, Carvedilol, IMDUR, and Amiodarone. Coreg and IMDUR were held on last hospitalization for low BP and continue to be held this admission. He does not appear to be on an ACEi at home. Does not appear to be in exacerbation at this time with breathing back to baseline per patient. However, proBNP was 9922 on admission with edema on initial xray. Did receive lasix on admission and one dose yesterday of IV 80mg . Weight up 4 pounds today to 177lb's with net negative fluid balance of 2.1L.  -will hold lasix at this time given worsening renal function and volume status appears at baseline  -would try to restart BB and nitrate if BP can tolerate eventually  Acute on chronic renal failure--Cr trending up to 1.55 today.  Likely ATN in setting of recent contrast from chest CT and diuresis.   Recent Labs Lab 10/10/13 0323 11/01/2013 1659 10/15/13 0315 10/30/2013 0254  CREATININE 1.20 1.21 1.40* 1.55*   -recommend deferring catheterization at this time as contrast would likely cause more insult to kidneys -hold lasix at this time and allow renal function to recover. Has not been on home diuretics. Can start po as needed.  -continue to trend renal function  Afib on chronic anticoagulation with xarelto--rate controlled with HR in 70's today. Xarelto has been held since last admission for GIB earlier this month. On amiodarone at home and continued on admission but coreg was held on prior admission and discharge due to hypotension.  -continue amiodarone -holding xarelto but will need to consider restarting at some point given. HAS-BLED score of at  least 4 with bleeding rate estimate of 8.7%/year.   Hx of recent GIB with several angiodysplastic lesions --required PRBCs during last admission and holding anticoagulation, no signs of active bleeding with stable Hb. tolerating ASA. Was to follow up with Dr. Hilarie Fredrickson as outpatient.  Hypothyroidism--TSH 1.7. Continued on home Levothyroxine Former smoker Hyperlipidemia--on statin.  -check lipid panel  Hx of Hodgkin lymphoma  LOS: 2 days   Case discussed and patient seen with Dr. Haroldine Laws  Signed: Jerene Pitch, MD PGY-2, Internal Medicine Resident Pager: [phone]  10/12/2013,10:13 AM  Patient seen and examined with Dr. Eula Fried. We discussed all aspects of the encounter. I agree with the assessment and plan as stated above.   Respiratory status much improved. He has has a small NSTEMI in setting of PNA. This is likely demand ischemia. He has been asx from a cardiac perspective. Given that he is asx and has worsening renal function with recent GIB would not proceed with cath. I think better strategy is to perform [facility] tomorrow for further risk stratification and only consider cath if veryhigh risk. Otherwise treat medically with ASA, statin. BP too low to start b-blocker.   Has chronic AF. Now off Xarelto due to recent bleeding AVMs would continue to hold for now. Can reconsider as outpatient. HF is stable. Stop lasix. He is not on any diuretics as outpatient. Can start as needed. No ACE or b-blocker due to renal function and low BP. Can go to tele.  Daniel R Bensimhon,MD 11:15 AM

## 2013-10-17 ENCOUNTER — Inpatient Hospital Stay (HOSPITAL_COMMUNITY): Payer: Medicare Other

## 2013-10-17 LAB — BASIC METABOLIC PANEL
BUN: 35 mg/dL — AB (ref 6–23)
CO2: 27 mEq/L (ref 19–32)
Calcium: 7.9 mg/dL — ABNORMAL LOW (ref 8.4–10.5)
Chloride: 100 mEq/L (ref 96–112)
Creatinine, Ser: 1.49 mg/dL — ABNORMAL HIGH (ref 0.50–1.35)
GFR calc Af Amer: 49 mL/min — ABNORMAL LOW (ref >90)
GFR, EST NON AFRICAN AMERICAN: 43 mL/min — AB (ref >90)
Glucose, Bld: 99 mg/dL (ref 70–99)
POTASSIUM: 4.2 meq/L (ref 3.7–5.3)
Sodium: 141 mEq/L (ref 137–147)

## 2013-10-17 LAB — CBC
HCT: 35.5 % — ABNORMAL LOW (ref 39.0–52.0)
Hemoglobin: 10.9 g/dL — ABNORMAL LOW (ref 13.0–17.0)
MCH: 28.5 pg (ref 26.0–34.0)
MCHC: 30.7 g/dL (ref 30.0–36.0)
MCV: 92.9 fL (ref 78.0–100.0)
Platelets: 291 10*3/uL (ref 150–400)
RBC: 3.82 MIL/uL — ABNORMAL LOW (ref 4.22–5.81)
RDW: 15.9 % — AB (ref 11.5–15.5)
WBC: 12.5 10*3/uL — ABNORMAL HIGH (ref 4.0–10.5)

## 2013-10-17 LAB — LIPID PANEL
Cholesterol: 94 mg/dL (ref 0–200)
HDL: 23 mg/dL — ABNORMAL LOW (ref >39)
LDL Cholesterol: 44 mg/dL (ref 0–99)
Total CHOL/HDL Ratio: 4.1 RATIO
Triglycerides: 135 mg/dL (ref <150)
VLDL: 27 mg/dL (ref 0–40)

## 2013-10-17 LAB — HEMOGLOBIN A1C
Hgb A1c MFr Bld: 5.4 % (ref <5.7)
Mean Plasma Glucose: 108 mg/dL (ref <117)

## 2013-10-17 MED ORDER — TECHNETIUM TC 99M SESTAMIBI GENERIC - CARDIOLITE
10.0000 | Freq: Once | INTRAVENOUS | Status: AC | PRN
  Administered 2013-10-17: 10 via INTRAVENOUS

## 2013-10-17 MED ORDER — TECHNETIUM TC 99M SESTAMIBI - CARDIOLITE: 30.0000 | Freq: Once | INTRAVENOUS | Status: AC | PRN

## 2013-10-17 MED ORDER — AZITHROMYCIN 500 MG PO TABS
500.0000 mg | ORAL_TABLET | Freq: Every day | ORAL | Status: DC
  Administered 2013-10-18 – 2013-10-21 (×4): 500 mg via ORAL
  Filled 2013-10-17 (×5): qty 1

## 2013-10-17 NOTE — Progress Notes (Signed)
SUBJECTIVE:  He denies any chest pain and says that his breathing is better.     PHYSICAL EXAM Filed Vitals:   10/17/13 0300 10/17/13 0400 10/17/13 0500 10/17/13 0600  BP:      Pulse: 62 73 40 73  Temp:  98 F (36.7 C)    TempSrc:  Oral    Resp: 16 29 25 20   Height:      Weight:      SpO2: 88% 99% 96% 98%   General:  No distress Lungs:  No crackles Heart:  Irregular Abdomen:  Positive bowel sounds, no rebound no guarding Extremities:  No edema   LABS: Lab Results  Component Value Date   TROPONINI 1.39* 10/15/2013   Results for orders placed during the hospital encounter of 13-Nov-2013 (from the past 24 hour(s))  BASIC METABOLIC PANEL     Status: Abnormal   Collection Time    10/17/13  3:09 AM      Result Value Ref Range   Sodium 141  137 - 147 mEq/L   Potassium 4.2  3.7 - 5.3 mEq/L   Chloride 100  96 - 112 mEq/L   CO2 27  19 - 32 mEq/L   Glucose, Bld 99  70 - 99 mg/dL   BUN 35 (*) 6 - 23 mg/dL   Creatinine, Ser 1.49 (*) 0.50 - 1.35 mg/dL   Calcium 7.9 (*) 8.4 - 10.5 mg/dL   GFR calc non Af Amer 43 (*) >90 mL/min   GFR calc Af Amer 49 (*) >90 mL/min  CBC     Status: Abnormal   Collection Time    10/17/13  3:09 AM      Result Value Ref Range   WBC 12.5 (*) 4.0 - 10.5 K/uL   RBC 3.82 (*) 4.22 - 5.81 MIL/uL   Hemoglobin 10.9 (*) 13.0 - 17.0 g/dL   HCT 35.5 (*) 39.0 - 52.0 %   MCV 92.9  78.0 - 100.0 fL   MCH 28.5  26.0 - 34.0 pg   MCHC 30.7  30.0 - 36.0 g/dL   RDW 15.9 (*) 11.5 - 15.5 %   Platelets 291  150 - 400 K/uL  LIPID PANEL     Status: Abnormal   Collection Time    10/17/13  3:09 AM      Result Value Ref Range   Cholesterol 94  0 - 200 mg/dL   Triglycerides 135  <150 mg/dL   HDL 23 (*) >39 mg/dL   Total CHOL/HDL Ratio 4.1     VLDL 27  0 - 40 mg/dL   LDL Cholesterol 44  0 - 99 mg/dL    Intake/Output Summary (Last 24 hours) at 10/17/13 3818 Last data filed at 10/17/13 0600  Gross per 24 hour  Intake   1280 ml  Output    975 ml  Net    305 ml      ASSESSMENT AND PLAN:  ELEVATED TROPONIN:  Felt to be secondary to acute pulmonary process.   Stress perfusion study today.  Further evaluation based on this.    ATRIAL FIB:  No anticoagulation with AVMs.  BP in the 70s yesterday. Improved this AM but still low.  Hold on titrating beta blocker.  Continue amiodarone.  Rate is OK.   ACUTE ON CHRONIC SYSTOLIC AND DIASTOLIC HF:  Seems to be euvolemic.  Holding diuretics with elevated creat.    CKD:  Creat is decreased slightly.  Holding diuretics.  Jeneen Rinks [facility] 10/17/2013 6:42 AM

## 2013-10-17 NOTE — Procedures (Signed)
Objective Swallowing Evaluation: Modified Barium Swallowing Study  Patient Details  Name: Wesley Wang MRN: [id number] Date of Birth: 1932-05-25  Today's Date: 10/17/2013 Time: 1120-1135 SLP Time Calculation (min): 15 min  Past Medical History:  Past Medical History  Diagnosis Date  . Heart attack 04/1994 & 12/2001    s/p ptca but has not had stents or bypass  . Prostate disorder   . Gallstones     Cholecystectomy 2005  . Cataracts, bilateral   . Hernia   . Atrial fibrillation   . Long term (current) use of anticoagulants     on coumadin  . Memory disturbance     (05/20/13) son & pt state note problems w/keeping a schedule. Can't remember what Wang of the wk its is or Drs appts. Also can't remember if he has taken his meds sometimes. No problems functionally. Son states memory loss is progressive & rapid. Short term problems.  . Chronic kidney disease   . Coronary artery disease     PTCA x 2  . Hypothyroidism   . Hyperlipidemia   . COPD (chronic obstructive pulmonary disease)   . Essential tremor   . Abdominal pain 04/22/13    RUQ  . Hodgkin lymphoma 2003  . Dysrhythmia     ATRIAL FIBRILATION   Past Surgical History:  Past Surgical History  Procedure Laterality Date  . Cholecystectomy  2005  . Cataract extraction, bilateral Bilateral 11/2010, 12/2010  . Ptca  04/1994 & 12/2001    x 2   . Hemorrhoid surgery    . Transurethral resection of prostate    . Esophagogastroduodenoscopy N/A 10/09/2013    Procedure: ESOPHAGOGASTRODUODENOSCOPY (EGD);  Surgeon: [name], MD;  Location: [facility] ENDOSCOPY;  Service: Endoscopy;  Laterality: N/A;  . Colonoscopy N/A 10/10/2013    Procedure: COLONOSCOPY;  Surgeon: [name], MD;  Location: [facility] ENDOSCOPY;  Service: Endoscopy;  Laterality: N/A;   HPI:  Pt is a very pleasant 80 yo recently treated for CAP and discharged 5/11 on Levaquin with residual dyspnea. Returned on 5/13 with increased dyspnea. In ED imaging showed acute on chronic R  lung parenchymal disease, R effusion and bilateral atelectasis. The patient was hypoxic, requiring BiPAP. MD suspects chronic aspiration and MBS performed to fully assess.      Assessment / Plan / Recommendation Clinical Impression  Dysphagia Diagnosis: Suspected primary esophageal dysphagia Clinical impression: SLP suspects a primary esophageal dysphagia following completion of MBS.  Oral and pharyngeal phases were, overall within functional limits with 2 instances laryngeal penetration (1 flash and 1 trace on upper 1/3 epiglottis) during large cup sips.  SLP does not diagnose deficits below the UES, however during esophageal scan, pt. appeared to have possible delayed transit to LES and retrograde movement of barium which could result in aspiration episodes.  Recommend pt. continue regular texture and thin liquid; SLP educated pt. re: esophageal precautions; pt. may benefit from esophageal work up if MD desires.      Treatment Recommendation  No treatment recommended at this time    Diet Recommendation Regular;Thin liquid   Liquid Administration via: Cup;No straw (pt. states never uses straws) Medication Administration: Whole meds with liquid Supervision: Patient able to self feed Compensations: Slow rate;Small sips/bites Postural Changes and/or Swallow Maneuvers: Seated upright 90 degrees;Upright 30-60 min after meal    Other  Recommendations Oral Care Recommendations: Oral care BID   Follow Up Recommendations  None    Frequency and Duration  Pertinent Vitals/Pain WDL    SLP Swallow Goals        Reason for Referral Objectively evaluate swallowing function   Oral Phase Oral Preparation/Oral Phase Oral Phase: WFL   Pharyngeal Phase Pharyngeal Phase Pharyngeal Phase: Impaired Pharyngeal - Nectar Pharyngeal - Nectar Cup: Within functional limits Pharyngeal - Thin Pharyngeal - Thin Cup: Pharyngeal residue - valleculae;Reduced tongue base  retraction;Penetration/Aspiration during swallow (residue min x 1) Penetration/Aspiration details (thin cup): Material enters airway, remains ABOVE vocal cords then ejected out;Material enters airway, remains ABOVE vocal cords and not ejected out (trace amount remained upper epiglottis after large sip) Pharyngeal - Solids Pharyngeal - Regular: Within functional limits  Cervical Esophageal Phase    GO    Cervical Esophageal Phase Cervical Esophageal Phase: Darryll Capers         Orbie Pyo Yelitza Reach 10/17/2013, 11:57 AM [phone]

## 2013-10-17 NOTE — Progress Notes (Signed)
Subjective: Wesley Wang was seen and examined today.  He feels well, denies dyspnea or chest pain.    Objective: Vital signs in last 24 hours: Filed Vitals:   10/17/13 0415 10/17/13 0500 10/17/13 0600 10/17/13 0754  BP: 94/64   97/59  Pulse: 70 40 73 72  Temp:    98 F (36.7 C)  TempSrc:    Oral  Resp: 27 25 20 22   Height:      Weight:      SpO2: 100% 96% 98% 97%   Weight change: 1.006 kg (2 lb 3.5 oz)  Intake/Output Summary (Last 24 hours) at 10/17/13 0849 Last data filed at 10/17/13 0700  Gross per 24 hour  Intake 1227.5 ml  Output    975 ml  Net  252.5 ml   General: resting in bed in NAD HEENT: no gross abnormality Cardiac: RRR, no rubs, murmurs or gallops Pulm: crackles on right (improved from yesterday), clear on left; no accessory muscle use; no respiratory distress Abd: soft, nontender, nondistended, BS present Ext: warm and well perfused, no pedal edema Neuro: alert and oriented X3, responding appropriately, able to move all four extremities voluntarily  Lab Results: Basic Metabolic Panel:  Recent Labs Lab 10/05/2013 2252  10/21/2013 0254 10/17/13 0309  NA  --   < > 137 141  K  --   < > 4.0 4.2  CL  --   < > 96 100  CO2  --   < > 26 27  GLUCOSE  --   < > 141* 99  BUN  --   < > 34* 35*  CREATININE  --   < > 1.55* 1.49*  CALCIUM  --   < > 8.0* 7.9*  MG 2.1  --   --   --   < > = values in this interval not displayed. CBC:  Recent Labs Lab 10/13/2013 1659  10/31/2013 0254 10/17/13 0309  WBC 21.2*  < > 18.1* 12.5*  NEUTROABS 17.9*  --   --   --   HGB 12.0*  < > 10.4* 10.9*  HCT 38.7*  < > 34.4* 35.5*  MCV 93.9  < > 94.2 92.9  PLT 291  < > 268 291  < > = values in this interval not displayed. Cardiac Enzymes:  Recent Labs Lab 10/12/2013 2252 10/15/13 0315 10/15/13 1042  TROPONINI 2.15* 1.82* 1.39*   Micro Results: Recent Results (from the past 240 hour(s))  MRSA PCR SCREENING     Status: None   Collection Time    10/08/13  7:23 PM   Result Value Ref Range Status   MRSA by PCR NEGATIVE  NEGATIVE Final   Comment:            The GeneXpert MRSA Assay (FDA     approved for NASAL specimens     only), is one component of a     comprehensive MRSA colonization     surveillance program. It is not     intended to diagnose MRSA     infection nor to guide or     monitor treatment for     MRSA infections.  CULTURE, BLOOD (ROUTINE X 2)     Status: None   Collection Time    10/03/2013  6:00 PM      Result Value Ref Range Status   Specimen Description BLOOD LEFT FOREARM   Final   Special Requests BOTTLES DRAWN AEROBIC AND ANAEROBIC [place]   Final  Culture  Setup Time     Final   Value: 10/13/2013 22:59     Performed at [facility]   Culture     Final   Value:        BLOOD CULTURE RECEIVED NO GROWTH TO DATE CULTURE WILL BE HELD FOR 5 DAYS BEFORE ISSUING A FINAL NEGATIVE REPORT     Performed at [facility]   Report Status PENDING   Incomplete  CULTURE, BLOOD (ROUTINE X 2)     Status: None   Collection Time    10/08/2013  6:10 PM      Result Value Ref Range Status   Specimen Description BLOOD ARM RIGHT   Final   Special Requests BOTTLES DRAWN AEROBIC AND ANAEROBIC 5CC   Final   Culture  Setup Time     Final   Value: 10/04/2013 22:59     Performed at [facility]   Culture     Final   Value:        BLOOD CULTURE RECEIVED NO GROWTH TO DATE CULTURE WILL BE HELD FOR 5 DAYS BEFORE ISSUING A FINAL NEGATIVE REPORT     Performed at [facility]   Report Status PENDING   Incomplete  MRSA PCR SCREENING     Status: None   Collection Time    10/15/13  4:40 PM      Result Value Ref Range Status   MRSA by PCR NEGATIVE  NEGATIVE Final   Comment:            The GeneXpert MRSA Assay (FDA     approved for NASAL specimens     only), is one component of a     comprehensive MRSA colonization     surveillance program. It is not     intended to diagnose MRSA     infection nor to guide or     monitor treatment  for     MRSA infections.   Studies/Results: No results found. Medications: I have reviewed the patient's current medications. Scheduled Meds: . amiodarone  200 mg Oral QPM  . antiseptic oral rinse  15 mL Mouth Rinse BID  . aspirin EC  81 mg Oral Daily  . atorvastatin  20 mg Oral q1800  . azithromycin  500 mg Intravenous Q24H  . citalopram  20 mg Oral QPM  . heparin  5,000 Units Subcutaneous 3 times per day  . levothyroxine  50 mcg Oral QAC breakfast  . pantoprazole  40 mg Oral Daily  . piperacillin-tazobactam (ZOSYN)  IV  3.375 g Intravenous 3 times per day  . sodium chloride  3 mL Intravenous Q12H  . vancomycin  1,250 mg Intravenous Q24H   Continuous Infusions: . sodium chloride Stopped (10/15/2013 1200)  none PRN Meds:.sodium chloride, albuterol, sodium chloride, technetium sestamibi, [COMPLETED] technetium sestamibi generic  Assessment/Plan: Acute hypoxemic respiratory failure 2/2 to HCAP:  PO2 43.0 at admission and patient required BiPAP.  Respiratory failure 2/2 R pneumonia in the setting of acute HF exacerbation (elevated proBNP, crackles on exam).  CTA negative for PE.  PCCM following and recommendations appreciated.  SpO2 upper 90s on 3-4L.   - continuous pulse ox monitoring, transfer to telemetry - continue nasal cannula; resume BiPAP if necessary - will treat as HCAP given recent hospitalization and lack of response to outpatient Levaquin - continue vancomycin, zosyn and azithromycin  - trend PCT  NSTEMI:  Troponin 1.54 --> 2.15--> 1.82 --> 1.39.  No CP.  Likely demand  in the setting of hypoxic respiratory failure and severe sepsis.  Cardiology consulted and recommendations appreciated.   - monitor on telemetry - cardiac cath deferred at this time given worsening renal function and etiology less likely acute ishcemia - continue ASA and statin - follow-up HgbA1c  - plan for Lexiscan Myoview today  Severe sepsis, resolving:  Leukocytosis (21.2), tachypnea, lung source  (HCAP) and lactic acidosis (4.04-->3.0).  BP low but stable, mostly 90s- 110s SBP.  WBC improving, 12.5 today. - treating HCAP as above - trend lactic acid   Acute on chronic combined HF, resolved: crackles, elevated proBNP.  Net negative 1.8L since admission.    - No more Lasix at this time as he seems stable and Cr elevated - strict I&Os, daily weights    AKI on CKD3:  Baseline Cr 1.3.  Admission Cr 1.21 --> 1.40--> 1.55--> 1.49.  Likely secondary to decreased po, low BP and contrast (for CTA). - monitor BMP  Atrial fibrillation:  Xarelto on hold since 10/08/13 in the setting of GI bleed. - continue amiodarone - will resume Xarelto as outpatient   Hypothyroidism:  continue synthroid  Anemia 2/2 to LGIB:  Patient admitted with GI bleed on 05/07-05/11/15 and found to have several AVMs on colonoscopy which were ablated with APC.  His hgb was 10.4 at d/c (improved from 8.0).  Hgb 12.0 --> 11.5--> 10.4-->10.9 this admission, no bleeding.   - monitor CBC - continue Protonix  Diet: NPO for Myoview VTE ppx:  Heparin [place], SCDs Code:  DNR  Dispo: Disposition is deferred at this time, awaiting improvement of current medical problems.  Anticipated discharge in approximately 2-3 day(s).   The patient does have a current PCP Leamon Arnt, MD) and does not need an [facility] hospital follow-up appointment after discharge.  The patient does not know have transportation limitations that hinder transportation to clinic appointments.  .Services Needed at time of discharge: Y = Yes, Blank = No PT:   OT:   RN:   Equipment:   Other:     LOS: 3 days   [name], DO 10/17/2013, 8:49 AM

## 2013-10-18 LAB — BASIC METABOLIC PANEL
BUN: 23 mg/dL (ref 6–23)
CHLORIDE: 102 meq/L (ref 96–112)
CO2: 27 mEq/L (ref 19–32)
Calcium: 8 mg/dL — ABNORMAL LOW (ref 8.4–10.5)
Creatinine, Ser: 1.34 mg/dL (ref 0.50–1.35)
GFR calc Af Amer: 56 mL/min — ABNORMAL LOW (ref >90)
GFR calc non Af Amer: 48 mL/min — ABNORMAL LOW (ref >90)
GLUCOSE: 77 mg/dL (ref 70–99)
Potassium: 4 mEq/L (ref 3.7–5.3)
Sodium: 141 mEq/L (ref 137–147)

## 2013-10-18 LAB — CBC
HCT: 37.2 % — ABNORMAL LOW (ref 39.0–52.0)
HEMOGLOBIN: 11.5 g/dL — AB (ref 13.0–17.0)
MCH: 28.4 pg (ref 26.0–34.0)
MCHC: 30.9 g/dL (ref 30.0–36.0)
MCV: 91.9 fL (ref 78.0–100.0)
Platelets: 321 10*3/uL (ref 150–400)
RBC: 4.05 MIL/uL — ABNORMAL LOW (ref 4.22–5.81)
RDW: 15.7 % — ABNORMAL HIGH (ref 11.5–15.5)
WBC: 11.5 10*3/uL — ABNORMAL HIGH (ref 4.0–10.5)

## 2013-10-18 LAB — GLUCOSE, CAPILLARY: Glucose-Capillary: 132 mg/dL — ABNORMAL HIGH (ref 70–99)

## 2013-10-18 NOTE — Progress Notes (Signed)
Patient ID: Wesley Wang, male   DOB: November 03, 1931, 80 y.o.   MRN: [id number]     Subjective:    No compliants   Objective:   Temp:  [97.5 F (36.4 C)-98 F (36.7 C)] 97.9 F (36.6 C) (05/17 0503) Pulse Rate:  [49-67] 60 (05/17 0503) Resp:  [18-23] 18 (05/17 0503) BP: (90-124)/(56-64) 90/59 mmHg (05/17 0503) SpO2:  [94 %-100 %] 94 % (05/17 0503) Weight:  [174 lb 9.7 oz (79.2 kg)-176 lb 3.2 oz (79.924 kg)] 174 lb 9.7 oz (79.2 kg) (05/17 0503) Last BM Date: 10/15/13  Filed Weights   10/17/13 0252 10/17/13 2031 10/18/13 0503  Weight: 175 lb 8 oz (79.606 kg) 176 lb 3.2 oz (79.924 kg) 174 lb 9.7 oz (79.2 kg)    Intake/Output Summary (Last 24 hours) at 10/18/13 0835 Last data filed at 10/18/13 0630  Gross per 24 hour  Intake    640 ml  Output    150 ml  Net    490 ml    Telemetry: afib, rages 60s  Exam:  General:NAD  Resp: clear anteriorally  Cardiac: irreg, rate 60, no m/r/g  GI: abdomen soft, NT, ND  MSK: no LE edema  Neuro: appropriate affect  Psych: no focal deficits  Lab Results:  Basic Metabolic Panel:  Recent Labs Lab 10/19/2013 2252  10/14/2013 0254 10/17/13 0309 10/18/13 0445  NA  --   < > 137 141 141  K  --   < > 4.0 4.2 4.0  CL  --   < > 96 100 102  CO2  --   < > 26 27 27   GLUCOSE  --   < > 141* 99 77  BUN  --   < > 34* 35* 23  CREATININE  --   < > 1.55* 1.49* 1.34  CALCIUM  --   < > 8.0* 7.9* 8.0*  MG 2.1  --   --   --   --   < > = values in this interval not displayed.  Liver Function Tests:  Recent Labs Lab 10/02/2013 1659  AST 40*  ALT 21  ALKPHOS 81  BILITOT 1.5*  PROT 6.7  ALBUMIN 2.7*    CBC:  Recent Labs Lab 10/11/2013 0254 10/17/13 0309 10/18/13 0445  WBC 18.1* 12.5* 11.5*  HGB 10.4* 10.9* 11.5*  HCT 34.4* 35.5* 37.2*  MCV 94.2 92.9 91.9  PLT 268 291 321    Cardiac Enzymes:  Recent Labs Lab 10/12/2013 2252 10/15/13 0315 10/15/13 1042  TROPONINI 2.15* 1.82* 1.39*    BNP:  Recent Labs  10/27/2013 1705    PROBNP 9922.0*    Coagulation:  Recent Labs Lab 10/19/2013 1659  INR 1.30    ECG:   Medications:   Scheduled Medications: . amiodarone  200 mg Oral QPM  . antiseptic oral rinse  15 mL Mouth Rinse BID  . aspirin EC  81 mg Oral Daily  . atorvastatin  20 mg Oral q1800  . azithromycin  500 mg Oral Daily  . citalopram  20 mg Oral QPM  . heparin  5,000 Units Subcutaneous 3 times per day  . levothyroxine  50 mcg Oral QAC breakfast  . pantoprazole  40 mg Oral Daily  . piperacillin-tazobactam (ZOSYN)  IV  3.375 g Intravenous 3 times per day  . vancomycin  1,250 mg Intravenous Q24H     Infusions:     PRN Medications:  albuterol  Jan 2015 Echo Study Conclusions  - Left ventricle: The cavity  size was mildly dilated. Wall thickness was normal. The estimated ejection fraction was 35%. Diffuse hypokinesis. Features are consistent with a pseudonormal left ventricular filling pattern, with concomitant abnormal relaxation and increased filling pressure (grade 2 diastolic dysfunction). E/medial e' > 15 suggests LV end diastolic pressure at least 20 mmHg. - Aortic valve: Trileaflet; moderately calcified leaflets. There is at least mild aortic stenosis visually but measured mean gradient not elevated. - Aorta: Ascending aortic diameter: 67mm (S). - Ascending aorta: The ascending aorta was mildly dilated. - Mitral valve: Moderately thickened annulus. Moderately calcified leaflets . Moderate regurgitation. - Left atrium: The atrium was moderately dilated. - Right ventricle: The cavity size was normal. Systolic function was mildly reduced. - Pulmonary arteries: No complete TR doppler jet so unable to estimate PA systolic pressure. - Systemic veins: IVC not visualized. Impressions:  - Mildly dilated LV with moderate diffuse hypokinesis, EF 35%. Moderate diastolic dysfunction with evidence for elevated filling pressure. Normal RV size with mildly reduced systolic function. At  least mild aortic stenosis visually but measured mean gradient not elevated. Moderate mitral regurgitation with calcified valve and apparatus.     Assessment/Plan   80 yo male with mild dementia, non-obstructive CAD from cath >10 years ago, chronic systolic heart failure LVEF 35%, afib on xarelto but held due to recent GI bleed, CKD, admitted with CAP and GI bleed.   1. NSTEMI - peak 2.15, now trending down. Thought likely to be demand ischemia based on prior notes in setting of pneumonia. He has not had any chest pain.  - given AKI and potential for demand ischemia MPI ordered, however it appears the stress machine is currently down and the test has not been completed as of yet - plan for MPI when available, continue ASA and statin. Will make NPO for stress possibly tomorrow if available.   2. Afib - anticoag on hold since last admission with GIB.  - maintained on amio, coreg has been on hold due to low bp's - tele shows afib with normal rates  3. Acute on chronic systolic/diastolic HF - chronic meds held in setting of hypotension initially - currently appears euvolemic. - bp still somewhat labile, will hold on restarting today  4. AKI - renal function improving - he does not have evidence of volume overload, and is not requiring diuretic.   5. PNA - abx per primary team    Carlyle Dolly, M.D., F.A.C.C.

## 2013-10-18 NOTE — Progress Notes (Signed)
Subjective: [name]. Pt was apparently in myoview on 10/17/13 when teh machine broke and therefore it was cancelled and to be completed on 10/19/13. Pt feels well/same. No active CP, improved SOB, no sites of gross bleeding. Pt just very hungry this AM.   Objective: Vital signs in last 24 hours: Filed Vitals:   10/17/13 1212 10/17/13 1716 10/17/13 2031 10/18/13 0503  BP: 124/63 115/64 105/56 90/59  Pulse: 49 61 67 60  Temp: 97.5 F (36.4 C) 98 F (36.7 C) 98 F (36.7 C) 97.9 F (36.6 C)  TempSrc: Oral Oral Oral Oral  Resp: 23 22 18 18   Height:      Weight:   176 lb 3.2 oz (79.924 kg) 174 lb 9.7 oz (79.2 kg)  SpO2: 98% 100% 94% 94%   Weight change: 11.2 oz (0.318 kg)  Intake/Output Summary (Last 24 hours) at 10/18/13 0838 Last data filed at 10/18/13 0630  Gross per 24 hour  Intake    640 ml  Output    150 ml  Net    490 ml   General: resting in bed in NAD HEENT: no gross abnormality Cardiac: RRR, no rubs, murmurs or gallops Pulm: crackles on right in base, some right sided wheezes, clear on left; no accessory muscle use; no respiratory distress Abd: soft, nontender, nondistended, BS present Ext: warm and well perfused, no pedal edema Neuro: alert and oriented X3, responding appropriately, able to move all four extremities voluntarily  Lab Results: Basic Metabolic Panel:  Recent Labs Lab Oct 15, 2013 2252  10/17/13 0309 10/18/13 0445  NA  --   < > 141 141  K  --   < > 4.2 4.0  CL  --   < > 100 102  CO2  --   < > 27 27  GLUCOSE  --   < > 99 77  BUN  --   < > 35* 23  CREATININE  --   < > 1.49* 1.34  CALCIUM  --   < > 7.9* 8.0*  MG 2.1  --   --   --   < > = values in this interval not displayed. CBC:  Recent Labs Lab 10/15/13 1659  10/17/13 0309 10/18/13 0445  WBC 21.2*  < > 12.5* 11.5*  NEUTROABS 17.9*  --   --   --   HGB 12.0*  < > 10.9* 11.5*  HCT 38.7*  < > 35.5* 37.2*  MCV 93.9  < > 92.9 91.9  PLT 291  < > 291 321  < > = values in this interval not  displayed. Cardiac Enzymes:  Recent Labs Lab Oct 15, 2013 2252 10/15/13 0315 10/15/13 1042  TROPONINI 2.15* 1.82* 1.39*   Micro Results: Recent Results (from the past 240 hour(s))  MRSA PCR SCREENING     Status: None   Collection Time    10/08/13  7:23 PM      Result Value Ref Range Status   MRSA by PCR NEGATIVE  NEGATIVE Final   Comment:            The GeneXpert MRSA Assay (FDA     approved for NASAL specimens     only), is one component of a     comprehensive MRSA colonization     surveillance program. It is not     intended to diagnose MRSA     infection nor to guide or     monitor treatment for     MRSA infections.  CULTURE, BLOOD (ROUTINE X  2)     Status: None   Collection Time    10/15/2013  6:00 PM      Result Value Ref Range Status   Specimen Description BLOOD LEFT FOREARM   Final   Special Requests BOTTLES DRAWN AEROBIC AND ANAEROBIC [facility]   Final   Culture  Setup Time     Final   Value: 10/24/2013 22:59     Performed at [facility]   Culture     Final   Value:        BLOOD CULTURE RECEIVED NO GROWTH TO DATE CULTURE WILL BE HELD FOR 5 DAYS BEFORE ISSUING A FINAL NEGATIVE REPORT     Performed at [facility]   Report Status PENDING   Incomplete  CULTURE, BLOOD (ROUTINE X 2)     Status: None   Collection Time    10/02/2013  6:10 PM      Result Value Ref Range Status   Specimen Description BLOOD ARM RIGHT   Final   Special Requests BOTTLES DRAWN AEROBIC AND ANAEROBIC 5CC   Final   Culture  Setup Time     Final   Value: 10/21/2013 22:59     Performed at [facility]   Culture     Final   Value:        BLOOD CULTURE RECEIVED NO GROWTH TO DATE CULTURE WILL BE HELD FOR 5 DAYS BEFORE ISSUING A FINAL NEGATIVE REPORT     Performed at [facility]   Report Status PENDING   Incomplete  MRSA PCR SCREENING     Status: None   Collection Time    10/15/13  4:40 PM      Result Value Ref Range Status   MRSA by PCR NEGATIVE  NEGATIVE Final    Comment:            The GeneXpert MRSA Assay (FDA     approved for NASAL specimens     only), is one component of a     comprehensive MRSA colonization     surveillance program. It is not     intended to diagnose MRSA     infection nor to guide or     monitor treatment for     MRSA infections.   Studies/Results: Dg Swallowing Func-speech Pathology  10/17/2013   Wesley Wang, CCC-SLP     10/17/2013 11:58 AM Objective Swallowing Evaluation: Modified Barium Swallowing Study   Patient Details  Name: Wesley Wang MRN: [id number] Date of Birth: [birth year]  Today's Date: 10/17/2013 Time: 1120-1135 SLP Time Calculation (min): 15 min  Past Medical History:  Past Medical History  Diagnosis Date  . Heart attack 04/1994 & 12/2001    s/p ptca but has not had stents or bypass  . Prostate disorder   . Gallstones     Cholecystectomy 2005  . Cataracts, bilateral   . Hernia   . Atrial fibrillation   . Long term (current) use of anticoagulants     on coumadin  . Memory disturbance     (05/20/13) son & pt state note problems w/keeping a schedule.  Can't remember what day of the wk its is or Drs appts. Also can't  remember if he has taken his meds sometimes. No problems  functionally. Son states memory loss is progressive & rapid.  Short term problems.  . Chronic kidney disease   . Coronary artery disease     PTCA x 2  . Hypothyroidism   .  Hyperlipidemia   . COPD (chronic obstructive pulmonary disease)   . Essential tremor   . Abdominal pain 04/22/13    RUQ  . Hodgkin lymphoma 2003  . Dysrhythmia     ATRIAL FIBRILATION   Past Surgical History:  Past Surgical History  Procedure Laterality Date  . Cholecystectomy  2005  . Cataract extraction, bilateral Bilateral 11/2010, 12/2010  . Ptca  04/1994 & 12/2001    x 2   . Hemorrhoid surgery    . Transurethral resection of prostate    . Esophagogastroduodenoscopy N/A 10/09/2013    Procedure: ESOPHAGOGASTRODUODENOSCOPY (EGD);  Surgeon: [name], MD;  Location[facility] ENDOSCOPY;   Service: Endoscopy;   Laterality: N/A;  . Colonoscopy N/A 10/10/2013    Procedure: COLONOSCOPY;  Surgeon: [name], MD;  Location:  [facility] ENDOSCOPY;  Service: Endoscopy;  Laterality: N/A;   HPI:  Pt is a very pleasant 80 yo recently treated for CAP and  discharged 5/11 on Levaquin with residual dyspnea. Returned on  5/13 with increased dyspnea. In ED imaging showed acute on  chronic R lung parenchymal disease, R effusion and bilateral  atelectasis. The patient was hypoxic, requiring BiPAP. MD  suspects chronic aspiration and MBS performed to fully assess.      Assessment / Plan / Recommendation Clinical Impression  Dysphagia Diagnosis: Suspected primary esophageal dysphagia Clinical impression: SLP suspects a primary esophageal dysphagia  following completion of MBS.  Oral and pharyngeal phases were,  overall within functional limits with 2 instances laryngeal  penetration (1 flash and 1 trace on upper 1/3 epiglottis) during  large cup sips.  SLP does not diagnose deficits below the UES,  however during esophageal scan, pt. appeared to have possible  delayed transit to LES and retrograde movement of barium which  could result in aspiration episodes.  Recommend pt. continue  regular texture and thin liquid; SLP educated pt. re: esophageal  precautions; pt. may benefit from esophageal work up if MD  desires.      Treatment Recommendation  No treatment recommended at this time    Diet Recommendation Regular;Thin liquid   Liquid Administration via: Cup;No straw (pt. states never uses  straws) Medication Administration: Whole meds with liquid Supervision: Patient able to self feed Compensations: Slow rate;Small sips/bites Postural Changes and/or Swallow Maneuvers: Seated upright 90  degrees;Upright 30-60 min after meal    Other  Recommendations Oral Care Recommendations: Oral care BID   Follow Up Recommendations  None    Frequency and Duration        Pertinent Vitals/Pain WDL    SLP Swallow Goals        Reason for  Referral Objectively evaluate swallowing function   Oral Phase Oral Preparation/Oral Phase Oral Phase: WFL   Pharyngeal Phase Pharyngeal Phase Pharyngeal Phase: Impaired Pharyngeal - Nectar Pharyngeal - Nectar Cup: Within functional limits Pharyngeal - Thin Pharyngeal - Thin Cup: Pharyngeal residue - valleculae;Reduced  tongue base retraction;Penetration/Aspiration during swallow  (residue min x 1) Penetration/Aspiration details (thin cup): Material enters  airway, remains ABOVE vocal cords then ejected out;Material  enters airway, remains ABOVE vocal cords and not ejected out  (trace amount remained upper epiglottis after large sip) Pharyngeal - Solids Pharyngeal - Regular: Within functional limits  Cervical Esophageal Phase    GO    Cervical Esophageal Phase Cervical Esophageal Phase: Darryll Capers         Wesley Pyo Litaker 10/17/2013, 11:57 AM [phone]   Medications: I have reviewed the patient's current medications. Scheduled  Meds: . amiodarone  200 mg Oral QPM  . antiseptic oral rinse  15 mL Mouth Rinse BID  . aspirin EC  81 mg Oral Daily  . atorvastatin  20 mg Oral q1800  . azithromycin  500 mg Oral Daily  . citalopram  20 mg Oral QPM  . heparin  5,000 Units Subcutaneous 3 times per day  . levothyroxine  50 mcg Oral QAC breakfast  . pantoprazole  40 mg Oral Daily  . piperacillin-tazobactam (ZOSYN)  IV  3.375 g Intravenous 3 times per day  . vancomycin  1,250 mg Intravenous Q24H   Continuous Infusions:  none PRN Meds:.albuterol  Assessment/Plan: Acute hypoxemic respiratory failure 2/2 to HCAP improving:  PO2 43.0 at admission and patient required BiPAP.  Respiratory failure 2/2 R pneumonia in the setting of acute HF exacerbation (elevated proBNP, crackles on exam).  CTA negative for PE.  PCCM following and recommendations appreciated.  SpO2 upper 90s on 3-4L.   - continuous pulse ox monitoring - continue nasal cannula; resume BiPAP if necessary - will treat as HCAP given recent hospitalization  and lack of response to outpatient Levaquin - continue vancomycin, zosyn and azithromycin  -will consider narrowing coverage as pt is dramatically improved 5/18  NSTEMI:  Troponin 1.54 --> 2.15--> 1.82 --> 1.39.  No CP.  Likely demand in the setting of hypoxic respiratory failure and severe sepsis.  Cardiology consulted and recommendations appreciated.   - monitor on telemetry - cardiac cath deferred at this time given worsening renal function and etiology less likely acute ishcemia - continue ASA and statin - plan for Lexiscan Myoview 5/18  - lipid panel: LDL 44, cholesterol 94   Severe sepsis, resolving:  Leukocytosis (21.2), tachypnea, lung source (HCAP) and lactic acidosis (4.04-->3.0).  BP low but stable, mostly 90s- 110s SBP.  WBC improving, 12.5 today. - treating HCAP as above - trend lactic acid which has    Acute on chronic combined HF, resolved: crackles, elevated proBNP.  Net negative 1.8L since admission.    - No more Lasix at this time as he seems stable and Cr elevated - strict I&Os, daily weights    AKI on CKD3:  Baseline Cr 1.3.  Admission Cr 1.21 --> 1.40--> 1.55--> 1.49>>1.34.  Likely secondary to decreased po, low BP and contrast (for CTA). - monitor BMP  Atrial fibrillation:  Xarelto on hold since 10/08/13 in the setting of GI bleed. - continue amiodarone - will resume Xarelto as outpatient   Hypothyroidism:  continue synthroid  Anemia 2/2 to LGIB:  Patient admitted with GI bleed on 05/07-05/11/15 and found to have several AVMs on colonoscopy which were ablated with APC.  His hgb was 10.4 at d/c (improved from 8.0).  Hgb 12.0 --> 11.5--> 10.4-->10.9 this admission, no bleeding.  - monitor CBC - continue Protonix  Diet: NPO for Myoview VTE ppx:  Heparin [place], SCDs Code:  DNR  Dispo: Disposition is deferred at this time, awaiting improvement of current medical problems.  Anticipated discharge in approximately 2-3 day(s).   The patient does have a current PCP  Leamon Arnt, MD) and does not need an [facility] hospital follow-up appointment after discharge.  The patient does not know have transportation limitations that hinder transportation to clinic appointments.  .Services Needed at time of discharge: Y = Yes, Blank = No PT:   OT:   RN:   Equipment:   Other:     LOS: 4 days   Clinton Gallant, MD 10/18/2013, 8:38 AM

## 2013-10-19 ENCOUNTER — Inpatient Hospital Stay (HOSPITAL_COMMUNITY): Payer: Medicare Other

## 2013-10-19 LAB — BLOOD GAS, ARTERIAL
Acid-Base Excess: 2 mmol/L (ref 0.0–2.0)
BICARBONATE: 25.2 meq/L — AB (ref 20.0–24.0)
Drawn by: 275531
O2 Content: 6 L/min
O2 Saturation: 79.1 %
PCO2 ART: 33.5 mmHg — AB (ref 35.0–45.0)
PH ART: 7.489 — AB (ref 7.350–7.450)
PO2 ART: 44.5 mmHg — AB (ref 80.0–100.0)
Patient temperature: 98.6
TCO2: 26.2 mmol/L (ref 0–100)

## 2013-10-19 LAB — CBC
HEMATOCRIT: 31.8 % — AB (ref 39.0–52.0)
Hemoglobin: 9.8 g/dL — ABNORMAL LOW (ref 13.0–17.0)
MCH: 28.2 pg (ref 26.0–34.0)
MCHC: 30.8 g/dL (ref 30.0–36.0)
MCV: 91.6 fL (ref 78.0–100.0)
Platelets: 319 10*3/uL (ref 150–400)
RBC: 3.47 MIL/uL — ABNORMAL LOW (ref 4.22–5.81)
RDW: 15.9 % — AB (ref 11.5–15.5)
WBC: 12.1 10*3/uL — ABNORMAL HIGH (ref 4.0–10.5)

## 2013-10-19 LAB — BASIC METABOLIC PANEL
BUN: 16 mg/dL (ref 6–23)
CHLORIDE: 100 meq/L (ref 96–112)
CO2: 26 mEq/L (ref 19–32)
CREATININE: 1.36 mg/dL — AB (ref 0.50–1.35)
Calcium: 7.6 mg/dL — ABNORMAL LOW (ref 8.4–10.5)
GFR calc Af Amer: 55 mL/min — ABNORMAL LOW (ref >90)
GFR calc non Af Amer: 48 mL/min — ABNORMAL LOW (ref >90)
Glucose, Bld: 85 mg/dL (ref 70–99)
Potassium: 3.1 mEq/L — ABNORMAL LOW (ref 3.7–5.3)
Sodium: 138 mEq/L (ref 137–147)

## 2013-10-19 MED ORDER — REGADENOSON 0.4 MG/5ML IV SOLN
0.4000 mg | Freq: Once | INTRAVENOUS | Status: AC
  Administered 2013-10-19: 0.4 mg via INTRAVENOUS

## 2013-10-19 MED ORDER — TECHNETIUM TC 99M SESTAMIBI GENERIC - CARDIOLITE
10.0000 | Freq: Once | INTRAVENOUS | Status: AC | PRN
  Administered 2013-10-17: 10 via INTRAVENOUS

## 2013-10-19 MED ORDER — TECHNETIUM TC 99M SESTAMIBI GENERIC - CARDIOLITE
30.0000 | Freq: Once | INTRAVENOUS | Status: AC | PRN
  Administered 2013-10-19: 30 via INTRAVENOUS

## 2013-10-19 MED ORDER — REGADENOSON 0.4 MG/5ML IV SOLN
INTRAVENOUS | Status: AC
  Administered 2013-10-19: 0.4 mg via INTRAVENOUS
  Filled 2013-10-19: qty 5

## 2013-10-19 MED ORDER — POTASSIUM CHLORIDE CRYS ER 20 MEQ PO TBCR
40.0000 meq | EXTENDED_RELEASE_TABLET | Freq: Once | ORAL | Status: AC
  Administered 2013-10-19: 40 meq via ORAL
  Filled 2013-10-19: qty 2

## 2013-10-19 NOTE — Progress Notes (Addendum)
S:  Patient with dyspnea and episodes of hypoxia while in nuclear medicine for Myoview.  SpO2 as low as high 70s with improvement to upper 80s with increased oxygen.  Stat ABG revealed pH 7.489, pCO2 33.5, pO2 44.5, SpO2 79%.  Stat CXR - confluent density throughout right lung may reflect atelectasis or PNA; pleural fluid; findings markedly progressed since 05/15 CXR.  Went to see patient and he said he felt a little dyspneic, denied chest pain.    O:  SpO2 91% on 6L via nasal cannula       General: laying flat in bed, in NAD, good color/no cyanosis       Pulm:  Dull to percussion on right, crackles on right, left lung clear, no accessory muscle use, no respiratory distress       Cardiac:  RRR, no m/r/g       Abd:  +BS, soft, NT       LE:  No edema, extremities warm         A/P:  80 year old male with CAD, chronic afib (off Xarelto for past 12 days 2/2 to GIB), recent GIB and community acquired pneumonia (admitted 05/07-05/11), readmitted on 10/04/2013 for acute hypoxic respiratory failure requiring BiPAP and NSTEMI.  Has been stable on [place] since hospital day 2 but developed hypoxia while at [facility] today.          1) repositioned patient in bed, elevated head of bead          2) switched oxygen supplement to 40% FiO2 via venturi mask; BiPAP if needed          3) repeat ABG          4) continue vancomycin, Zosyn and azithromycin          5) patient would like to be intubated if it is for an acute illness such as PNA that he had the potential to recover from          6) re-consult PCCM          7) low threshold to transfer to SDU or ICU should he decompensate         [name], DO IMTS, PGY1 10/19/2013

## 2013-10-19 NOTE — Progress Notes (Signed)
1630 Seen by Dr . Nelda Marseille as [facility]  Consult. To keep 02 sat>88%

## 2013-10-19 NOTE — Progress Notes (Signed)
Pharmacist Heart Failure Core Measure Documentation  Assessment: Wesley Wang has an EF documented as 35% on 07/01/13 by ECHO.  Rationale: Heart failure patients with left ventricular systolic dysfunction (LVSD) and an EF < 40% should be prescribed an angiotensin converting enzyme inhibitor (ACEI) or angiotensin receptor blocker (ARB) at discharge unless a contraindication is documented in the medical record.  This patient is not currently on an ACEI or ARB for HF.  This note is being placed in the record in order to provide documentation that a contraindication to the use of these agents is present for this encounter.  ACE Inhibitor or Angiotensin Receptor Blocker is contraindicated (specify all that apply)  []   ACEI allergy AND ARB allergy []   Angioedema []   Moderate or severe aortic stenosis []   Hyperkalemia [x]   Hypotension []   Renal artery stenosis [x]   Worsening renal function, preexisting renal disease or dysfunction   [name] [name] 10/19/2013 1:52 PM

## 2013-10-19 NOTE — Progress Notes (Signed)
Subjective: No acute events overnight.   Patient seen and examined this AM.  He denies complaint, no CP, dyspnea, abdominal or leg pain.  NPO for myoview.  Objective: Vital signs in last 24 hours: Filed Vitals:   10/18/13 1400 10/18/13 2212 10/19/13 0152 10/19/13 0711  BP: 98/65 96/55 100/68 98/65  Pulse: 65 85 88 72  Temp: 97.8 F (36.6 C) 97.9 F (36.6 C) 97.6 F (36.4 C) 98.2 F (36.8 C)  TempSrc: Oral Oral Oral Oral  Resp: 18 20 20 18   Height:      Weight:    78.85 kg (173 lb 13.3 oz)  SpO2: 89% 92% 100% 92%   Weight change:   Intake/Output Summary (Last 24 hours) at 10/19/13 0925 Last data filed at 10/19/13 0807  Gross per 24 hour  Intake   1240 ml  Output    410 ml  Net    830 ml   General: resting in bed in NAD HEENT: no gross abnormality Cardiac: RRR, no rubs, murmurs or gallops Pulm: crackles on R; no accessory muscle use; no respiratory distress Abd: soft, nontender, nondistended, BS present Ext: warm and well perfused, no pedal edema Neuro: alert and oriented X3, responding appropriately, able to move all four extremities voluntarily  Lab Results: Basic Metabolic Panel:  Recent Labs Lab 10/13/2013 2252  10/18/13 0445 10/19/13 0437  NA  --   < > 141 138  K  --   < > 4.0 3.1*  CL  --   < > 102 100  CO2  --   < > 27 26  GLUCOSE  --   < > 77 85  BUN  --   < > 23 16  CREATININE  --   < > 1.34 1.36*  CALCIUM  --   < > 8.0* 7.6*  MG 2.1  --   --   --   < > = values in this interval not displayed. CBC:  Recent Labs Lab 10/13/2013 1659  10/18/13 0445 10/19/13 0437  WBC 21.2*  < > 11.5* 12.1*  NEUTROABS 17.9*  --   --   --   HGB 12.0*  < > 11.5* 9.8*  HCT 38.7*  < > 37.2* 31.8*  MCV 93.9  < > 91.9 91.6  PLT 291  < > 321 319  < > = values in this interval not displayed. Cardiac Enzymes:  Recent Labs Lab 10/29/2013 2252 10/15/13 0315 10/15/13 1042  TROPONINI 2.15* 1.82* 1.39*   Micro Results: Recent Results (from the past 240 hour(s))  CULTURE,  BLOOD (ROUTINE X 2)     Status: None   Collection Time    10/10/2013  6:00 PM      Result Value Ref Range Status   Specimen Description BLOOD LEFT FOREARM   Final   Special Requests BOTTLES DRAWN AEROBIC AND ANAEROBIC [facility]   Final   Culture  Setup Time     Final   Value: 10/21/2013 22:59     Performed at [facility]   Culture     Final   Value:        BLOOD CULTURE RECEIVED NO GROWTH TO DATE CULTURE WILL BE HELD FOR 5 DAYS BEFORE ISSUING A FINAL NEGATIVE REPORT     Performed at [facility]   Report Status PENDING   Incomplete  CULTURE, BLOOD (ROUTINE X 2)     Status: None   Collection Time    10/24/2013  6:10 PM  Result Value Ref Range Status   Specimen Description BLOOD ARM RIGHT   Final   Special Requests BOTTLES DRAWN AEROBIC AND ANAEROBIC 5CC   Final   Culture  Setup Time     Final   Value: 17-Oct-2013 22:59     Performed at [facility]   Culture     Final   Value:        BLOOD CULTURE RECEIVED NO GROWTH TO DATE CULTURE WILL BE HELD FOR 5 DAYS BEFORE ISSUING A FINAL NEGATIVE REPORT     Performed at [facility]   Report Status PENDING   Incomplete  MRSA PCR SCREENING     Status: None   Collection Time    10/15/13  4:40 PM      Result Value Ref Range Status   MRSA by PCR NEGATIVE  NEGATIVE Final   Comment:            The GeneXpert MRSA Assay (FDA     approved for NASAL specimens     only), is one component of a     comprehensive MRSA colonization     surveillance program. It is not     intended to diagnose MRSA     infection nor to guide or     monitor treatment for     MRSA infections.   Studies/Results: Dg Swallowing Func-speech Pathology  10/17/2013   Orbie Pyo [place]ron[place], CCC-SLP     10/17/2013 11:58 AM Objective Swallowing Evaluation: Modified Barium Swallowing Study   Patient Details  Name: Wesley Wang MRN: [id number] Date of Birth: 01-31-1932  Today's Date: 10/17/2013 Time: 1120-1135 SLP Time Calculation (min): 15 min  Past  Medical History:  Past Medical History  Diagnosis Date  . Heart attack 04/1994 & 12/2001    s/p ptca but has not had stents or bypass  . Prostate disorder   . Gallstones     Cholecystectomy 2005  . Cataracts, bilateral   . Hernia   . Atrial fibrillation   . Long term (current) use of anticoagulants     on coumadin  . Memory disturbance     (05/20/13) son & pt state note problems w/keeping a schedule.  Can't remember what day of the wk its is or Drs appts. Also can't  remember if he has taken his meds sometimes. No problems  functionally. Son states memory loss is progressive & rapid.  Short term problems.  . Chronic kidney disease   . Coronary artery disease     PTCA x 2  . Hypothyroidism   . Hyperlipidemia   . COPD (chronic obstructive pulmonary disease)   . Essential tremor   . Abdominal pain 04/22/13    RUQ  . Hodgkin lymphoma 2003  . Dysrhythmia     ATRIAL FIBRILATION   Past Surgical History:  Past Surgical History  Procedure Laterality Date  . Cholecystectomy  2005  . Cataract extraction, bilateral Bilateral 11/2010, 12/2010  . Ptca  04/1994 & 12/2001    x 2   . Hemorrhoid surgery    . Transurethral resection of prostate    . Esophagogastroduodenoscopy N/A 10/09/2013    Procedure: ESOPHAGOGASTRODUODENOSCOPY (EGD);  Surgeon: [name], MD;  Location: [facility] ENDOSCOPY;  Service: Endoscopy;   Laterality: N/A;  . Colonoscopy N/A 10/10/2013    Procedure: COLONOSCOPY;  Surgeon: [name], MD;  Location:  [facility] ENDOSCOPY;  Service: Endoscopy;  Laterality: N/A;   HPI:  Pt is a very pleasant 80 yo  recently treated for CAP and  discharged 5/11 on Levaquin with residual dyspnea. Returned on  5/13 with increased dyspnea. In ED imaging showed acute on  chronic R lung parenchymal disease, R effusion and bilateral  atelectasis. The patient was hypoxic, requiring BiPAP. MD  suspects chronic aspiration and MBS performed to fully assess.      Assessment / Plan / Recommendation Clinical Impression  Dysphagia Diagnosis: Suspected primary  esophageal dysphagia Clinical impression: SLP suspects a primary esophageal dysphagia  following completion of MBS.  Oral and pharyngeal phases were,  overall within functional limits with 2 instances laryngeal  penetration (1 flash and 1 trace on upper 1/3 epiglottis) during  large cup sips.  SLP does not diagnose deficits below the UES,  however during esophageal scan, pt. appeared to have possible  delayed transit to LES and retrograde movement of barium which  could result in aspiration episodes.  Recommend pt. continue  regular texture and thin liquid; SLP educated pt. re: esophageal  precautions; pt. may benefit from esophageal work up if MD  desires.      Treatment Recommendation  No treatment recommended at this time    Diet Recommendation Regular;Thin liquid   Liquid Administration via: Cup;No straw (pt. states never uses  straws) Medication Administration: Whole meds with liquid Supervision: Patient able to self feed Compensations: Slow rate;Small sips/bites Postural Changes and/or Swallow Maneuvers: Seated upright 90  degrees;Upright 30-60 min after meal    Other  Recommendations Oral Care Recommendations: Oral care BID   Follow Up Recommendations  None    Frequency and Duration        Pertinent Vitals/Pain WDL    SLP Swallow Goals        Reason for Referral Objectively evaluate swallowing function   Oral Phase Oral Preparation/Oral Phase Oral Phase: WFL   Pharyngeal Phase Pharyngeal Phase Pharyngeal Phase: Impaired Pharyngeal - Nectar Pharyngeal - Nectar Cup: Within functional limits Pharyngeal - Thin Pharyngeal - Thin Cup: Pharyngeal residue - valleculae;Reduced  tongue base retraction;Penetration/Aspiration during swallow  (residue min x 1) Penetration/Aspiration details (thin cup): Material enters  airway, remains ABOVE vocal cords then ejected out;Material  enters airway, remains ABOVE vocal cords and not ejected out  (trace amount remained upper epiglottis after large sip) Pharyngeal - Solids  Pharyngeal - Regular: Within functional limits  Cervical Esophageal Phase    GO    Cervical Esophageal Phase Cervical Esophageal Phase: Darryll Capers         Orbie Pyo Litaker 10/17/2013, 11:57 AM [id number]   Medications: I have reviewed the patient's current medications. Scheduled Meds: . amiodarone  200 mg Oral QPM  . antiseptic oral rinse  15 mL Mouth Rinse BID  . aspirin EC  81 mg Oral Daily  . atorvastatin  20 mg Oral q1800  . azithromycin  500 mg Oral Daily  . citalopram  20 mg Oral QPM  . heparin  5,000 Units Subcutaneous 3 times per day  . levothyroxine  50 mcg Oral QAC breakfast  . pantoprazole  40 mg Oral Daily  . piperacillin-tazobactam (ZOSYN)  IV  3.375 g Intravenous 3 times per day  . potassium chloride  40 mEq Oral Once  . vancomycin  1,250 mg Intravenous Q24H   Continuous Infusions:  none PRN Meds:.albuterol  Assessment/Plan: Acute hypoxemic respiratory failure 2/2 to HCAP improving:  PO2 43.0 at admission and patient required BiPAP.  Respiratory failure 2/2 R pneumonia in the setting of acute HF exacerbation (elevated proBNP, crackles on exam).  CTA  negative for PE.  PCCM following and recommendations appreciated.  SpO2 92% on 4L. - continuous pulse ox monitoring - continue nasal cannula and try and wean; resume BiPAP if necessary - will treat as HCAP given recent hospitalization and lack of response to outpatient Levaquin - continue vancomycin, zosyn and azithromycin  -will consider narrowing coverage as pt has improved  NSTEMI:  Troponin 1.54 --> 2.15--> 1.82 --> 1.39.  No CP.  Likely demand in the setting of hypoxic respiratory failure and severe sepsis.  Cardiology consulted and recommendations appreciated.  Lipid panel: LDL 44, cholesterol 94.  Was to have Myoview but machine down this weekend, plan for today or as soon as machine back up. - monitor on telemetry - cardiac cath deferred at this time given worsening renal function and etiology less likely acute ishcemia -  continue ASA and statin - plan for [facility] as soon as machine back up  Severe sepsis, resolving:  Leukocytosis (21.2), tachypnea, lung source (HCAP) and lactic acidosis (4.04-->3.0).  BP low but stable, mostly 90s-110s SBP.  WBC improving, 12.1 today. - treating HCAP as above - trend lactic acid    Acute on chronic combined HF, resolved: crackles, elevated proBNP.  Weight even since admission.  ~ net even. - No more Lasix at this time as he seems stable and Cr elevated - strict I&Os, daily weights    AKI on CKD3:  Baseline Cr 1.3.  Admission Cr 1.21 --> 1.40--> 1.55>>1.34-->1.36.  Likely secondary to decreased po, low BP and contrast (for CTA). - monitor BMP  Atrial fibrillation:  NSR, rate controlled.  Xarelto on hold since 10/08/13 in the setting of GI bleed. - continue amiodarone - will resume Xarelto as outpatient   Hypothyroidism:  continue synthroid  Anemia 2/2 to LGIB:  Patient admitted with GI bleed on 05/07-05/11/15 and found to have several AVMs on colonoscopy which were ablated with APC.  His hgb was 10.4 at d/c (improved from 8.0).  Hgb 12.0 --> 11.5 >> 9.8 this admission, no bleeding.   - monitor CBC and for S&S of bleeding - continue Protonix  Diet: NPO for Myoview VTE ppx:  Heparin Butts, SCDs Code:  DNR  Dispo: Disposition is deferred at this time, awaiting improvement of current medical problems.  Anticipated discharge in approximately 2-3 day(s).   The patient does have a current PCP Leamon Arnt, MD) and does not need an [facility] hospital follow-up appointment after discharge.  The patient does not know have transportation limitations that hinder transportation to clinic appointments.  .Services Needed at time of discharge: Y = Yes, Blank = No PT:   OT:   RN:   Equipment:   Other:     LOS: 5 days   [name], DO 10/19/2013, 9:25 AM

## 2013-10-19 NOTE — Progress Notes (Signed)
ABG done at 1315. Had 2nd set of cardiac pictures done with sp02 running in mid 80's. On Telemetry the entire visit to Nuclear medicine and Sp02 monitored. CXR done at 1415. Accompanied by Wyvonne Lenz RN for CXR and then transferred to 3E07. Report given to his nurse Deneise Lever and she is aware p02 results 44. She will make sure R Barrett PA is aware. Patient pink with resps mid 20's. Sp02 86 on 6 L . Patient does admit to some SOB. Skin pink and warm and dry.

## 2013-10-19 NOTE — Progress Notes (Signed)
Patient ID: Wesley Wang, male   DOB: 05/08/1932, 80 y.o.   MRN: [id number] Internal Medicine Attending  Date: 10/19/2013  Patient name: Wesley Wang Medical record number: [id number] Date of birth: 08/29/1931 Age: 80 y.o. Gender: male  I saw interviewed and examined the patient, and personally reviewed the x-rays together with the resident and the radiologist. discussed .  Elderly man who sustained a non-ST wave elevation myocardial infarction one week after a May 7  admission for an anticoagulant related GI hemorrhage with fall in his hemoglobin down to 8 g from his baseline hemoglobin of 14 g likely precipitating the subsequent ischemic event. At time of readmission on May 13, and a chest radiograph showed diffuse right lung airspace disease consistent with pneumonia. He was started on broad-spectrum parenteral antibiotics. He went down for a cardiac study earlier this afternoon. He developed increasing respiratory distress and fall in his oxygen saturation. Followup chest shows progressive, diffuse, airway disease limited to the right lung. He currently appears comfortable at rest. Respiratory rate 24. He is acyanotic. He is not using accessory muscles to breathe. He is afebrile. There are dry rales at the right lung base and dullness to percussion without any any one third the way up on the right side. Good air movement throughout the left lung. Regular cardiac rhythm without murmur gallop or rub. No peripheral edema. No calf tenderness. Impression: #1. Progression of hospital associated pneumonia while on broad-spectrum antibiotics. Plan: Oxygen concentration increased to 40% Ventimask. Saturation has come up to 94%. He will be watched closely. He previously had a "DO NOT RESUSCITATE" order. However, I discussed with him that he appears to have a potentially reversible infection and that if his pulmonary status deteriorates to the point where he needs temporary ventilator support, I would  recommend this. He is in agreement.

## 2013-10-19 NOTE — Progress Notes (Addendum)
Evaluated pt in nuc med. Pt not on O2 PTA, currently requiring 4 lpm to maintain O2 > 85%. Seen by IM resident this am, felt to be doing well. Pt denies SOB, feels he did OK with ambulation yesterday. Will recheck CXR as sats seem to be lower. F/u on CL results.  Lexiscan CL performed. Pt had SOB and oxygen levels dropped to high 70s, but improved. Results pending.  Sats dropping back into the 70s at times. Will ck ABG.   Lonn Georgia, PA-C 10/19/2013 12:52 PM Beeper [phone]

## 2013-10-19 NOTE — Progress Notes (Signed)
PT Cancellation Note  Patient Details Name: Wesley Wang MRN: [id number] DOB: March 16, 1932   Cancelled Treatment:    Reason Eval/Treat Not Completed: Medical issues which prohibited therapy. Discussed pt case with RN who asks that PT hold for today. Pt back on non-rebreather mask and RN states pt desaturating more today than usual. Will continue to follow.    Jolyn Lent 10/19/2013, 3:40 PM  Jolyn Lent, PT, DPT Acute Rehabilitation Services Pager: [phone]

## 2013-10-19 NOTE — Progress Notes (Signed)
Spo2 83 to 92 pre Lexiscan stress test, on 4 L [place]. Pt denies SOB.R Barrett aware and also aware K+ 3.1 During Lexiscan Sp02 dropped into the 70's and 02 increased to 6L. R Barrett PA present. Decreased back to 4L during test as Sp02 increasing. Post test dropped into the 70's, Denies SOB, not cyanotic and skin warm and dry. R Barrett present. ABG stat ordered. PO potassium given. Floor nurse Deneise Lever aware of problems with Sp02 and that po potassium given.

## 2013-10-19 NOTE — Progress Notes (Signed)
UR completed Stpehanie Montroy K. Blessyn [place], RN, BSN, [place], CCM  10/19/2013 10:53 AM

## 2013-10-19 NOTE — Care Management Note (Addendum)
  Page 2 of 2   10/20/2013     11:35:10 AM CARE MANAGEMENT NOTE 10/20/2013  Patient:  Wesley Wang, Wesley Wang   Account Number:  [id number]  Date Initiated:  10/15/2013  Documentation initiated by:  Elissa Hefty  Subjective/Objective Assessment:   adm w hypoxia     Action/Plan:   from [place] per chart. act w adv homecare for hhpt and hhot   Anticipated DC Date:  10/21/2013   Anticipated DC Plan:  [place] referral  Clinical Social Worker      DC Planning Services  CM consult      [facility] Choice  Resumption Of Svcs/PTA Provider   Choice offered to / List presented to:          [facility] arranged  [place] [facility]      [place].   Status of service:  Completed, signed off Medicare Important Message given?  YES (If response is "NO", the following Medicare IM given date fields will be blank) Date Medicare IM given:  10/09/2013 Date Additional Medicare IM given:  10/19/2013  Discharge Disposition:    Per UR Regulation:  Reviewed for med. necessity/level of care/duration of stay  If discussed at [place] of Stay Meetings, dates discussed:    Comments:  Zo Loudon RN, BSN, MSHL, CCM  Nurse - Case Manager, (Unit [facility])  [phone]  10/20/2013 Hx/o initial IM provided at 10/21/2013 / 5:32:23 PM 2nd IM 10/19/2013 Social:  From [place]; patient wishes to return back to [place]. Case discussed with [facility] for LTAC review on 10/19/2013 Case discussed with [facility] - contact Juliann Pulse states bed available to day CM contacted MD First Contact: Dr. [name] Pager: [phone] MD updates that Patients medical/respiratory status has changed this am and on Bipap;  Patient transferred to stepdown Unit.   MD states willing to review for LTAC once patient LOC has improved. PT RECS:  _________pending Disposition Plan:  pending

## 2013-10-19 NOTE — Progress Notes (Addendum)
PULMONARY / CRITICAL CARE MEDICINE  Name: Wesley Wang MRN: [id number] DOB: 11-Jun-1931    ADMISSION DATE:  10/04/2013 CONSULTATION DATE:  07/18/2013  REFERRING MD :  Dareen Piano PRIMARY SERVICE:  IMTS  CHIEF COMPLAINT:  Pneumonia?  BRIEF PATIENT DESCRIPTION: 80 yo recently treated for CAP and discharged 5/11 on Levaquin with residual dyspnea.  Returned on 5/13 with increased dyspnea.  In ED imaging demon started acute on chronic R lung  parenchymal disease, R effusion and bilateral atelectasis.  The patient was hypoxic, requiring BiPAP.  PCCM was consulted. PCCM signed off on 5/15.  Pt had myoview 5/18 and became hypoxic once again.  CXR showed worsening of right lung density.  PCCM re-consulted.  SIGNIFICANT EVENTS / STUDIES:  5/13  Chest CTA >>> No PE, acute on chronic R lung  parenchymal disease, R effusion and bilateral bronchiectasis  5/15 PCCM signed off 5/18 Myoview >>> pt became hypoxic with SpO2 into 70's.  CXR with progression of right sided density PCCM reconsulted.  LINES / TUBES: PIV  CULTURES: Blood 5/14>>>  ANTIBIOTICS: Vancomycin 5/13 >>> Zosyn  5/13 >>> Azithromycin 5/14 >>>  SUBJECTIVE: Became hypoxic while in myoview study earlier, SpO2 into 70s.  VITAL SIGNS: Temp:  [97.6 F (36.4 C)-98.2 F (36.8 C)] 97.8 F (36.6 C) (05/18 1428) Pulse Rate:  [72-89] 89 (05/18 1501) Resp:  [16-20] 16 (05/18 1501) BP: (96-134)/(55-84) 134/84 mmHg (05/18 1428) SpO2:  [78 %-100 %] 94 % (05/18 1501) FiO2 (%):  [40 %] 40 % (05/18 1501) Weight:  [78.85 kg (173 lb 13.3 oz)] 78.85 kg (173 lb 13.3 oz) (05/18 0711)  PHYSICAL EXAMINATION: General:  Pleasant male, resting in bed on Venti-Mask.  In NAD. Neuro:  A&O x 3. HEENT:  [place]/AT. Cardiovascular:  IRIR, no M/R/G. Lungs:  Decreased BS on right.  No W/R/R. Abdomen:  Soft, non tender, bowel sounds present Musculoskeletal:  Moves all extremities Skin:  Intact  LABS: CBC  Recent Labs Lab 10/17/13 0309 10/18/13 0445  10/19/13 0437  WBC 12.5* 11.5* 12.1*  HGB 10.9* 11.5* 9.8*  HCT 35.5* 37.2* 31.8*  PLT 291 321 319   Coag's  Recent Labs Lab 10/20/2013 1659  APTT 29  INR 1.30   BMET  Recent Labs Lab 10/17/13 0309 10/18/13 0445 10/19/13 0437  NA 141 141 138  K 4.2 4.0 3.1*  CL 100 102 100  CO2 27 27 26   BUN 35* 23 16  CREATININE 1.49* 1.34 1.36*  GLUCOSE 99 77 85   Electrolytes  Recent Labs Lab 10/28/2013 2252  10/17/13 0309 10/18/13 0445 10/19/13 0437  CALCIUM  --   < > 7.9* 8.0* 7.6*  MG 2.1  --   --   --   --   < > = values in this interval not displayed. Sepsis Markers  Recent Labs Lab 10/08/2013 1735 10/30/2013 2252 10/15/13 0315 10/15/2013 0254  LATICACIDVEN 4.04*  --   --  3.0*  PROCALCITON  --  0.11 0.13 0.12   ABG  Recent Labs Lab 10/28/2013 1801 10/18/2013 2307 10/19/13 1310  PHART 7.480* 7.492* 7.489*  PCO2ART 26.2* 25.7* 33.5*  PO2ART 43.0* 62.0* 44.5*   Liver Enzymes  Recent Labs Lab 10/05/2013 1659  AST 40*  ALT 21  ALKPHOS 81  BILITOT 1.5*  ALBUMIN 2.7*   Cardiac Enzymes  Recent Labs Lab 10/21/2013 1705 10/19/2013 2252 10/15/13 0315 10/15/13 1042  TROPONINI  --  2.15* 1.82* 1.39*  PROBNP 9922.0*  --   --   --  Glucose  Recent Labs Lab 10/18/13 2221  GLUCAP 132*   IMAGING: Dg Chest 2 View  10/19/2013   CLINICAL DATA:  Shortness of breath.  EXAM: CHEST  2 VIEW  COMPARISON:  PA and lateral chest x-ray of Oct 15, 2013  FINDINGS: Since the previous study the right hemithorax has become nearly opacified by confluent alveolar densities and likely by pleural fluid. There is no shift of the mediastinum. The left lung is well-expanded and clear. The left heart border appears normal. There is tortuosity of the descending thoracic aorta.  IMPRESSION: Confluent density throughout the you right lung may reflect atelectasis or pneumonia but there is no shift of the mediastinum. There is likely pleural fluid present as well. These findings have markedly  progressed since the study of May 14th.   Electronically Signed   By: David  Martinique   On: 10/19/2013 14:22   ASSESSMENT / PLAN:   Acute hypoxemic respiratory failure Possible aspiration pneumonia vs HCAP - likely chronic, pt reports having trouble with swallowing for past 6 months. ? Right pleural effusion DNI / DNR status - Continue supplemental oxygen with goal SpO2 88 - 92%. - Continue Vancomycin/Zosyn/Zithromax - Pulmonary Hygiene. - Swallow precautions per SLP recs.  Discussed code status with Mr. Dayhoff again after he had a discussion with primary team regarding intubation if needed.  Although his pneumonia is possibly reversible as mentioned by primary team; the underlying problem of chronic aspiration will most likely continue.  Given the fact that he is at increased risk for remaining ventilator dependent if intubated, he has decided to remain DNR at this point.  PCCM will sign off.  Please do not hesitate to call back if we can be of any further assistance.  Montey Hora, PA - [facility] [place] [facility] Pgr: [phone]  or [phone]  I discussed the patient care with him at length.  See above note, the pneumonia is reversible but the cause of it (aspiration) is not and will likely continue to recur.  He needs more time with abx but more importantly decrease further insult via aspiration with the precautions mentioned in speech pathology's note.  I doubt infiltrate will ever clear as long as he continues to aspirate.  He clearly informed me he has been having trouble swallowing for the past six months.  Based on that, we discussed code status and he wishes to remain DNR/DNI.  An ABG now will add nothing to his care and only cause pain with puncture as the patient is very unlikely to be in acute respiratory acidosis given his mental status.  His respiratory failure is strictly hypoxic which can be followed by O2 sats that have correlated in the past.  Based on that  ABG was cancelled.    Also recommend keeping patient as dry as able to.  PCCM will sign off, please call back if needed.  Patient seen and examined, agree with above note.  I dictated the care and orders written for this patient under my direction.  Rush Farmer, M.D. [facility] Pulmonary/Critical Care Medicine. Pager: [phone]. After hours pager: [phone].

## 2013-10-19 NOTE — Progress Notes (Signed)
Provided unit CSW handoff. This CSW signing off.   Ky Barban, MSW, [place] Clinical Social Worker

## 2013-10-19 NOTE — Progress Notes (Signed)
[id number] Spoken with Dr. Redmond Pulling . Pt placed on VM 40 % continue to monitor pt

## 2013-10-19 NOTE — Progress Notes (Signed)
Wesley Wang placed a call to [place] . Spoken to her aware of ABG and  chest x ray results . Pt awake , speaks in complete  Sentences. Respiration unlabored. O2 sat 85 on [place] 6 l/min . Placed a call to [facility]  MD

## 2013-10-19 NOTE — Progress Notes (Signed)
[place] for Vancomycin and Zosyn Indication: HCAP  Allergies  Allergen Reactions  . Sulfa Antibiotics Hives    Patient Measurements: Height: 5\' 7"  (170.2 cm) Weight: 173 lb 13.3 oz (78.85 kg) IBW/kg (Calculated) : 66.1  Vital Signs: Temp: 98.2 F (36.8 C) (05/18 0711) Temp src: Oral (05/18 0711) BP: 98/65 mmHg (05/18 0711) Pulse Rate: 72 (05/18 0711) Intake/Output from previous day: 05/17 0701 - 05/18 0700 In: 1480 [P.O.:1080; IV Piggyback:400] Out: 510 [Urine:500; Stool:10] Intake/Output from this shift:    Labs:  Recent Labs  10/17/13 0309 10/18/13 0445 10/19/13 0437  WBC 12.5* 11.5* 12.1*  HGB 10.9* 11.5* 9.8*  PLT 291 321 319  CREATININE 1.49* 1.34 1.36*   Estimated Creatinine Clearance: 40.5 ml/min (by C-G formula based on Cr of 1.36). No results found for this basename: VANCOTROUGH, Corlis Leak, VANCORANDOM, [place], GENTPEAK, GENTRANDOM, TOBRATROUGH, TOBRAPEAK, TOBRARND, AMIKACINPEAK, AMIKACINTROU, AMIKACIN,  in the last 72 hours   Microbiology: Recent Results (from the past 720 hour(s))  MRSA PCR SCREENING     Status: None   Collection Time    10/08/13  7:23 PM      Result Value Ref Range Status   MRSA by PCR NEGATIVE  NEGATIVE Final   Comment:            The GeneXpert MRSA Assay (FDA     approved for NASAL specimens     only), is one component of a     comprehensive MRSA colonization     surveillance program. It is not     intended to diagnose MRSA     infection nor to guide or     monitor treatment for     MRSA infections.  CULTURE, BLOOD (ROUTINE X 2)     Status: None   Collection Time    10/30/2013  6:00 PM      Result Value Ref Range Status   Specimen Description BLOOD LEFT FOREARM   Final   Special Requests BOTTLES DRAWN AEROBIC AND ANAEROBIC [facility]   Final   Culture  Setup Time     Final   Value: 23-Oct-2013 22:59     Performed at [facility]   Culture     Final   Value:        BLOOD CULTURE  RECEIVED NO GROWTH TO DATE CULTURE WILL BE HELD FOR 5 DAYS BEFORE ISSUING A FINAL NEGATIVE REPORT     Performed at [facility]   Report Status PENDING   Incomplete  CULTURE, BLOOD (ROUTINE X 2)     Status: None   Collection Time    Oct 23, 2013  6:10 PM      Result Value Ref Range Status   Specimen Description BLOOD ARM RIGHT   Final   Special Requests BOTTLES DRAWN AEROBIC AND ANAEROBIC 5CC   Final   Culture  Setup Time     Final   Value: 10/04/2013 22:59     Performed at [facility]   Culture     Final   Value:        BLOOD CULTURE RECEIVED NO GROWTH TO DATE CULTURE WILL BE HELD FOR 5 DAYS BEFORE ISSUING A FINAL NEGATIVE REPORT     Performed at [facility]   Report Status PENDING   Incomplete  MRSA PCR SCREENING     Status: None   Collection Time    10/15/13  4:40 PM      Result Value Ref Range Status  MRSA by PCR NEGATIVE  NEGATIVE Final   Comment:            The GeneXpert MRSA Assay (FDA     approved for NASAL specimens     only), is one component of a     comprehensive MRSA colonization     surveillance program. It is not     intended to diagnose MRSA     infection nor to guide or     monitor treatment for     MRSA infections.   Assessment: 80 y.o. male presents from assisted living with SOB. Recently discharged 5/11 from [facility] (treated for GIB and CAP). Pt was on rocephin/azithromycin 5/7-5/10 then changed to levaquin 5/10-admission. Now on broad spectrum antibiotics (Vanc and Zosyn) for HCAP. SCr 1.36 , est CrCl ~35-75mL/min. 5/14 Blood cultures with NGTD.  Goal of Therapy:  Vancomycin trough level 15-20 mcg/ml  Plan:  1. Continue Zosyn 3.375gm IV q8h EI 2. Cont vancomycin 1250mg  IV q24h  3. Azithromyin 500mg  po q24 per MD 4. Will f/u micro data, renal function, pt's clinical condition, trough prn  [place] Pharmacist Pager: [phone] 10/19/2013 9:58 AM

## 2013-10-19 NOTE — Progress Notes (Signed)
80 yo male with mild dementia, non-obstructive CAD from cath >10 years ago, chronic systolic heart failure LVEF 35%, afib on xarelto but held due to recent GI bleed, CKD, admitted with CAP and GI bleed.       Subjective: No complaints, no chest pain, no SOB  Objective: Vital signs in last 24 hours: Temp:  [97.6 F (36.4 C)-98.2 F (36.8 C)] 98.2 F (36.8 C) (05/18 0711) Pulse Rate:  [65-88] 72 (05/18 0711) Resp:  [18-20] 18 (05/18 0711) BP: (96-100)/(54-68) 98/65 mmHg (05/18 0711) SpO2:  [88 %-100 %] 92 % (05/18 0711) Weight:  [173 lb 13.3 oz (78.85 kg)] 173 lb 13.3 oz (78.85 kg) (05/18 0711) Weight change:  Last BM Date: 10/19/13 Intake/Output from previous day: +370 (wt 173.13 down from 177)  05/17 0701 - 05/18 0700 In: 880 [P.O.:780; IV Piggyback:100] Out: 510 [Urine:500; Stool:10] Intake/Output this shift:    PE: General:Pleasant affect, NAD Skin:Warm and dry, brisk capillary refill HEENT:normocephalic, sclera clear, mucus membranes moist Neck:supple, no JVD Heart:S1S2 RRR without murmur, gallup, rub or click Lungs:clear without rales, +  Rhonchi on rt, no wheezes WUX:LKGM, non tender, + BS, do not palpate liver spleen or masses Ext:no lower ext edema, 2+ pedal pulses, 2+ radial pulses Neuro:alert and oriented, MAE, follows commands, + facial symmetry   Lab Results:  Recent Labs  10/18/13 0445 10/19/13 0437  WBC 11.5* 12.1*  HGB 11.5* 9.8*  HCT 37.2* 31.8*  PLT 321 319   BMET  Recent Labs  10/18/13 0445 10/19/13 0437  NA 141 138  K 4.0 3.1*  CL 102 100  CO2 27 26  GLUCOSE 77 85  BUN 23 16  CREATININE 1.34 1.36*  CALCIUM 8.0* 7.6*   No results found for this basename: TROPONINI, CK, MB,  in the last 72 hours  Lab Results  Component Value Date   CHOL 94 10/17/2013   HDL 23* 10/17/2013   LDLCALC 44 10/17/2013   TRIG 135 10/17/2013   CHOLHDL 4.1 10/17/2013   Lab Results  Component Value Date   HGBA1C 5.4 10/17/2013     Lab Results    Component Value Date   TSH 1.730 10/21/2013    Hepatic Function Panel No results found for this basename: PROT, ALBUMIN, AST, ALT, ALKPHOS, BILITOT, BILIDIR, IBILI,  in the last 72 hours  Recent Labs  10/17/13 0309  CHOL 94   No results found for this basename: PROTIME,  in the last 72 hours     Studies/Results: Dg Swallowing Func-speech Pathology  10/17/2013   Orbie Pyo Litaker, CCC-SLP     10/17/2013 11:58 AM Objective Swallowing Evaluation: Modified Barium Swallowing Study   Patient Details  Name: Wesley Wang MRN: [id number] Date of Birth: January 13, 1932  Today's Date: 10/17/2013 Time: 6440-3474 SLP Time Calculation (min): 15 min  Past Medical History:  Past Medical History  Diagnosis Date  . Heart attack 04/1994 & 12/2001    s/p ptca but has not had stents or bypass  . Prostate disorder   . Gallstones     Cholecystectomy 2005  . Cataracts, bilateral   . Hernia   . Atrial fibrillation   . Long term (current) use of anticoagulants     on coumadin  . Memory disturbance     (05/20/13) son & pt state note problems w/keeping a schedule.  Can't remember what day of the wk its is or Drs appts. Also can't  remember if he has taken his meds sometimes. No  problems  functionally. Son states memory loss is progressive & rapid.  Short term problems.  . Chronic kidney disease   . Coronary artery disease     PTCA x 2  . Hypothyroidism   . Hyperlipidemia   . COPD (chronic obstructive pulmonary disease)   . Essential tremor   . Abdominal pain 04/22/13    RUQ  . Hodgkin lymphoma 2003  . Dysrhythmia     ATRIAL FIBRILATION   Past Surgical History:  Past Surgical History  Procedure Laterality Date  . Cholecystectomy  2005  . Cataract extraction, bilateral Bilateral 11/2010, 12/2010  . Ptca  04/1994 & 12/2001    x 2   . Hemorrhoid surgery    . Transurethral resection of prostate    . Esophagogastroduodenoscopy N/A 10/09/2013    Procedure: ESOPHAGOGASTRODUODENOSCOPY (EGD);  Surgeon: [name], MD;  Location: [facility]  ENDOSCOPY;  Service: Endoscopy;   Laterality: N/A;  . Colonoscopy N/A 10/10/2013    Procedure: COLONOSCOPY;  Surgeon: [name], MD;  Location:  [facility] ENDOSCOPY;  Service: Endoscopy;  Laterality: N/A;   HPI:  Pt is a very pleasant 80 yo recently treated for CAP and  discharged 5/11 on Levaquin with residual dyspnea. Returned on  5/13 with increased dyspnea. In ED imaging showed acute on  chronic R lung parenchymal disease, R effusion and bilateral  atelectasis. The patient was hypoxic, requiring BiPAP. MD  suspects chronic aspiration and MBS performed to fully assess.      Assessment / Plan / Recommendation Clinical Impression  Dysphagia Diagnosis: Suspected primary esophageal dysphagia Clinical impression: SLP suspects a primary esophageal dysphagia  following completion of MBS.  Oral and pharyngeal phases were,  overall within functional limits with 2 instances laryngeal  penetration (1 flash and 1 trace on upper 1/3 epiglottis) during  large cup sips.  SLP does not diagnose deficits below the UES,  however during esophageal scan, pt. appeared to have possible  delayed transit to LES and retrograde movement of barium which  could result in aspiration episodes.  Recommend pt. continue  regular texture and thin liquid; SLP educated pt. re: esophageal  precautions; pt. may benefit from esophageal work up if MD  desires.      Treatment Recommendation  No treatment recommended at this time    Diet Recommendation Regular;Thin liquid   Liquid Administration via: Cup;No straw (pt. states never uses  straws) Medication Administration: Whole meds with liquid Supervision: Patient able to self feed Compensations: Slow rate;Small sips/bites Postural Changes and/or Swallow Maneuvers: Seated upright 90  degrees;Upright 30-60 min after meal    Other  Recommendations Oral Care Recommendations: Oral care BID   Follow Up Recommendations  None    Frequency and Duration        Pertinent Vitals/Pain WDL    SLP Swallow Goals        Reason  for Referral Objectively evaluate swallowing function   Oral Phase Oral Preparation/Oral Phase Oral Phase: WFL   Pharyngeal Phase Pharyngeal Phase Pharyngeal Phase: Impaired Pharyngeal - Nectar Pharyngeal - Nectar Cup: Within functional limits Pharyngeal - Thin Pharyngeal - Thin Cup: Pharyngeal residue - valleculae;Reduced  tongue base retraction;Penetration/Aspiration during swallow  (residue min x 1) Penetration/Aspiration details (thin cup): Material enters  airway, remains ABOVE vocal cords then ejected out;Material  enters airway, remains ABOVE vocal cords and not ejected out  (trace amount remained upper epiglottis after large sip) Pharyngeal - Solids Pharyngeal - Regular: Within functional limits  Cervical Esophageal Phase  GO    Cervical Esophageal Phase Cervical Esophageal Phase: Darryll Capers         Orbie Pyo Litaker 10/17/2013, 11:57 AM [phone]    Medications: I have reviewed the patient's current medications. Scheduled Meds: . amiodarone  200 mg Oral QPM  . antiseptic oral rinse  15 mL Mouth Rinse BID  . aspirin EC  81 mg Oral Daily  . atorvastatin  20 mg Oral q1800  . azithromycin  500 mg Oral Daily  . citalopram  20 mg Oral QPM  . heparin  5,000 Units Subcutaneous 3 times per day  . levothyroxine  50 mcg Oral QAC breakfast  . pantoprazole  40 mg Oral Daily  . piperacillin-tazobactam (ZOSYN)  IV  3.375 g Intravenous 3 times per day  . vancomycin  1,250 mg Intravenous Q24H   Continuous Infusions:  PRN Meds:.albuterol  Assessment/Plan: 1. NSTEMI  - peak 2.15, now trending down. Thought likely to be demand ischemia based on prior notes in setting of pneumonia. He has not had any chest pain.  - given AKI and potential for demand ischemia MPI ordered, however it appears the stress machine is currently down and the test has not been completed as of yet to be done today 10/19/13 - plan for MPI when available, continue ASA and statin. Will make NPO for stress possibly tomorrow if available.    2. Afib --now converted to a fib. - anticoag on hold since last admission with GIB.  - maintained on amio, coreg has been on hold due to low bp's  - tele shows SBrady with PACs, PVCs converted during the night  HR to 40 3. Acute on chronic systolic/diastolic HF  - chronic meds held in setting of hypotension initially and continues borderline BP - currently appears euvolemic.  - bp still somewhat labile, will hold on restarting today  4. AKI  - renal function improving  - he does not have evidence of volume overload, and is not requiring diuretic.  5. PNA  - abx per primary team  6.  Hypokalemia- replace  LOS: 5 days   Time spent with pt. :15 minutes. Cecilie Kicks  Nurse Practitioner Certified Pager [phone] or after 5pm and on weekends call [phone] 10/19/2013, 8:10 AM   Agree with note written by Cecilie Kicks RNP  Pt was out of room getting an MPI. Results pending. Disposition later this PM based on results.  Lorretta Harp 10/19/2013 2:47 PM

## 2013-10-20 ENCOUNTER — Inpatient Hospital Stay (HOSPITAL_COMMUNITY): Payer: Medicare Other

## 2013-10-20 DIAGNOSIS — I453 Trifascicular block: Secondary | ICD-10-CM

## 2013-10-20 LAB — CBC WITH DIFFERENTIAL/PLATELET
Basophils Absolute: 0 10*3/uL (ref 0.0–0.1)
Basophils Relative: 0 % (ref 0–1)
EOS PCT: 1 % (ref 0–5)
Eosinophils Absolute: 0.1 10*3/uL (ref 0.0–0.7)
HEMATOCRIT: 36.4 % — AB (ref 39.0–52.0)
HEMOGLOBIN: 11.2 g/dL — AB (ref 13.0–17.0)
LYMPHS ABS: 1.1 10*3/uL (ref 0.7–4.0)
Lymphocytes Relative: 8 % — ABNORMAL LOW (ref 12–46)
MCH: 28.5 pg (ref 26.0–34.0)
MCHC: 30.8 g/dL (ref 30.0–36.0)
MCV: 92.6 fL (ref 78.0–100.0)
MONOS PCT: 7 % (ref 3–12)
Monocytes Absolute: 1 10*3/uL (ref 0.1–1.0)
NEUTROS ABS: 12 10*3/uL — AB (ref 1.7–7.7)
Neutrophils Relative %: 84 % — ABNORMAL HIGH (ref 43–77)
Platelets: 370 10*3/uL (ref 150–400)
RBC: 3.93 MIL/uL — ABNORMAL LOW (ref 4.22–5.81)
RDW: 15.7 % — ABNORMAL HIGH (ref 11.5–15.5)
WBC: 14.2 10*3/uL — AB (ref 4.0–10.5)

## 2013-10-20 LAB — CULTURE, BLOOD (ROUTINE X 2)
CULTURE: NO GROWTH
Culture: NO GROWTH

## 2013-10-20 LAB — BLOOD GAS, ARTERIAL
ACID-BASE DEFICIT: 2.1 mmol/L — AB (ref 0.0–2.0)
Acid-Base Excess: 0.5 mmol/L (ref 0.0–2.0)
BICARBONATE: 21.2 meq/L (ref 20.0–24.0)
Bicarbonate: 23.7 mEq/L (ref 20.0–24.0)
DELIVERY SYSTEMS: POSITIVE
DRAWN BY: 24486
Drawn by: 24486
Expiratory PAP: 5
FIO2: 40 %
FIO2: 0.6 %
INSPIRATORY PAP: 12
O2 SAT: 87 %
O2 Saturation: 85.9 %
PCO2 ART: 30.4 mmHg — AB (ref 35.0–45.0)
Patient temperature: 98.6
Patient temperature: 98.6
TCO2: 22.2 mmol/L (ref 0–100)
TCO2: 24.7 mmol/L (ref 0–100)
pCO2 arterial: 32.3 mmHg — ABNORMAL LOW (ref 35.0–45.0)
pH, Arterial: 7.459 — ABNORMAL HIGH (ref 7.350–7.450)
pH, Arterial: 7.479 — ABNORMAL HIGH (ref 7.350–7.450)
pO2, Arterial: 54.6 mmHg — ABNORMAL LOW (ref 80.0–100.0)
pO2, Arterial: 51.2 mmHg — ABNORMAL LOW (ref 80.0–100.0)

## 2013-10-20 LAB — BASIC METABOLIC PANEL
BUN: 14 mg/dL (ref 6–23)
CHLORIDE: 110 meq/L (ref 96–112)
CO2: 22 meq/L (ref 19–32)
Calcium: 8.1 mg/dL — ABNORMAL LOW (ref 8.4–10.5)
Creatinine, Ser: 1.31 mg/dL (ref 0.50–1.35)
GFR calc Af Amer: 58 mL/min — ABNORMAL LOW (ref >90)
GFR calc non Af Amer: 50 mL/min — ABNORMAL LOW (ref >90)
Glucose, Bld: 81 mg/dL (ref 70–99)
POTASSIUM: 4.4 meq/L (ref 3.7–5.3)
SODIUM: 145 meq/L (ref 137–147)

## 2013-10-20 MED ORDER — WHITE PETROLATUM GEL
Status: AC
  Administered 2013-10-20
  Filled 2013-10-20: qty 5

## 2013-10-20 NOTE — Progress Notes (Signed)
eLink Physician-Brief Progress Note Patient Name: Wesley Wang DOB: 07-26-1931 MRN: [id number]  Date of Service  10/20/2013   HPI/Events of Note   Lengthy conversation with son.  I explained to him that his father has recurrent aspiration.  The son tells me his father has early dementia.  Further, he has stated in multiple occassions in the past that he he did not want life support.  I also explained that we are concerned that should he go on the vent, it may be difficult to extubate him.  eICU Interventions  Based on our conversation, the son confirmed DNR/DNI status.  He will discuss further with his sister.  Should he make a turn for the worse tonight we will NOT intubate the patient and will focus on his comfort.   Intervention Category Major Interventions: Respiratory failure - evaluation and management  Juanito Doom 10/20/2013, 10:51 PM

## 2013-10-20 NOTE — Progress Notes (Signed)
Patient ID: Wesley Wang, male   DOB: [birth year], 80 y.o.   MRN: [id number] Attending physician note: The patient was interviewed and examined together with the healthcare team. He has extensive right lung pneumonia. He increased work of breathing. Decreased oxygen saturation. He was changed back to BiPAP oxygen with improvement of his arterial blood gas. He will be moved to a step down unit. We will continue to monitor his status closely. Pulmonary critical care medicine and assisting in management. Murriel Hopper, M.D., [place]

## 2013-10-20 NOTE — Progress Notes (Signed)
PT Cancellation Note  Patient Details Name: Wesley Wang MRN: [id number] DOB: 1931/09/03   Cancelled Treatment:    Reason Eval/Treat Not Completed: Medical issues which prohibited therapy. Noted pt now in SDU on BIPAP. At rest RR28-30 with SaO2 90-91%. CCM in to see pt and agreed to hold therapy today. Will follow and resume as appropriate.   Jeanie Cooks Debborah Alonge 10/20/2013, 3:54 PM Pager [phone]

## 2013-10-20 NOTE — Progress Notes (Addendum)
Subjective: Became hypoxic while down for Myoview yesterday.  Required venturi mask since yesterday afternoon, up to 8L this AM.  Patient seen and examined this AM.  He reported no dyspnea or chest pain, however when examined by another member of the primary team he reported dyspnea and said he felt he was tiring out.    Objective: Vital signs in last 24 hours: Filed Vitals:   10/20/13 1015 10/20/13 1030 10/20/13 1213 10/20/13 1240  BP: 113/54 116/63 116/81   Pulse: 79 80 89   Temp:    98.4 F (36.9 C)  TempSrc:    Axillary  Resp: 23 22 27    Height:      Weight:      SpO2: 97% 96% 94%    Weight change:   Intake/Output Summary (Last 24 hours) at 10/20/13 1301 Last data filed at 10/20/13 0827  Gross per 24 hour  Intake    240 ml  Output    301 ml  Net    -61 ml   General: resting in bed in NAD, venturi mask in place HEENT: no gross abnormality Cardiac: RRR, no rubs, murmurs or gallops Pulm: crackles on R; no accessory muscle use; no respiratory distress Abd: soft, nontender, BS present, minimal distention Ext: warm and well perfused, no pedal edema Neuro: alert and oriented X3, responding appropriately, able to move all four extremities voluntarily  Lab Results: Basic Metabolic Panel:  Recent Labs Lab 10/21/2013 2252  10/19/13 0437 10/20/13 0505  NA  --   < > 138 145  K  --   < > 3.1* 4.4  CL  --   < > 100 110  CO2  --   < > 26 22  GLUCOSE  --   < > 85 81  BUN  --   < > 16 14  CREATININE  --   < > 1.36* 1.31  CALCIUM  --   < > 7.6* 8.1*  MG 2.1  --   --   --   < > = values in this interval not displayed. CBC:  Recent Labs Lab 10/25/2013 1659  10/19/13 0437 10/20/13 0505  WBC 21.2*  < > 12.1* 14.2*  NEUTROABS 17.9*  --   --  12.0*  HGB 12.0*  < > 9.8* 11.2*  HCT 38.7*  < > 31.8* 36.4*  MCV 93.9  < > 91.6 92.6  PLT 291  < > 319 370  < > = values in this interval not displayed. Cardiac Enzymes:  Recent Labs Lab 10/18/2013 2252 10/15/13 0315 10/15/13 1042   TROPONINI 2.15* 1.82* 1.39*   Micro Results: Recent Results (from the past 240 hour(s))  CULTURE, BLOOD (ROUTINE X 2)     Status: None   Collection Time    10/29/2013  6:00 PM      Result Value Ref Range Status   Specimen Description BLOOD LEFT FOREARM   Final   Special Requests BOTTLES DRAWN AEROBIC AND ANAEROBIC [facility]   Final   Culture  Setup Time     Final   Value: 10/27/2013 22:59     Performed at [facility]   Culture     Final   Value: NO GROWTH 5 DAYS     Performed at [facility]   Report Status 10/20/2013 FINAL   Final  CULTURE, BLOOD (ROUTINE X 2)     Status: None   Collection Time    10/06/2013  6:10 PM  Result Value Ref Range Status   Specimen Description BLOOD ARM RIGHT   Final   Special Requests BOTTLES DRAWN AEROBIC AND ANAEROBIC 5CC   Final   Culture  Setup Time     Final   Value: 10/19/2013 22:59     Performed at [facility]   Culture     Final   Value: NO GROWTH 5 DAYS     Performed at [facility]   Report Status 10/20/2013 FINAL   Final  MRSA PCR SCREENING     Status: None   Collection Time    10/15/13  4:40 PM      Result Value Ref Range Status   MRSA by PCR NEGATIVE  NEGATIVE Final   Comment:            The GeneXpert MRSA Assay (FDA     approved for NASAL specimens     only), is one component of a     comprehensive MRSA colonization     surveillance program. It is not     intended to diagnose MRSA     infection nor to guide or     monitor treatment for     MRSA infections.   Studies/Results: Dg Chest 2 View  10/19/2013   CLINICAL DATA:  Shortness of breath.  EXAM: CHEST  2 VIEW  COMPARISON:  PA and lateral chest x-ray of Oct 15, 2013  FINDINGS: Since the previous study the right hemithorax has become nearly opacified by confluent alveolar densities and likely by pleural fluid. There is no shift of the mediastinum. The left lung is well-expanded and clear. The left heart border appears normal. There is tortuosity  of the descending thoracic aorta.  IMPRESSION: Confluent density throughout the you right lung may reflect atelectasis or pneumonia but there is no shift of the mediastinum. There is likely pleural fluid present as well. These findings have markedly progressed since the study of May 14th.   Electronically Signed   By: David  [place]   On: 10/19/2013 14:22   Nm Myocar Multi W/spect W/wall Motion / Ef  10/19/2013   CLINICAL DATA:  Chest pain.  Pneumonia.  EXAM: MYOCARDIAL IMAGING WITH SPECT (REST AND PHARMACOLOGIC-STRESS)  GATED LEFT VENTRICULAR WALL MOTION STUDY  LEFT VENTRICULAR EJECTION FRACTION  TECHNIQUE: Standard myocardial SPECT imaging was performed after resting intravenous injection of 10 mCi Tc-39m sestamibi. Subsequently, intravenous infusion of Lexiscan was performed under the supervision of the Cardiology staff. At peak effect of the drug, 30 mCi Tc-31m sestamibi was injected intravenously and standard myocardial SPECT imaging was performed. Quantitative gated imaging was also performed to evaluate left ventricular wall motion, and estimate left ventricular ejection fraction.  COMPARISON:  Radiographs 10/15/2013.  CT 10/19/2013.  FINDINGS: The left ventricle is dilated. There is a large predominantly fixed scar involving the apex and all distal myocardial segments. Within the adjacent septum, mild reversibility is demonstrated with a summed difference score of 6.  The QGS ejection fraction calculated at rest is 38% with an end-diastolic volume of and an end-systolic volume of 3ml. There is apical hypokinesis with mild paradoxical motion within the distal anterior wall.  IMPRESSION: 1. Left apical infarct with associated hypokinesis and mild paradoxical motion within the distal anterior wall. 2. Possible peri-infarct ischemia involving the mid septum. 3. Left ventricular ejection fraction is 38%.   Electronically Signed   By: Roxy Horseman M.D.   On: 10/19/2013 16:34   Dg Chest [facility]  10/20/2013   CLINICAL DATA:  80 year old male with respiratory failure. Hypoxia. Initial encounter.  EXAM: PORTABLE CHEST - 1 VIEW  COMPARISON:  10/19/2013 and earlier.  FINDINGS: Portable AP semi upright view at 1123 hrs. Continued near complete opacification of the right hemi thorax with volume loss and some rightward shift of the mediastinum. New left perihilar and basilar reticular nodular density in the left lung. Stable cardiac size and mediastinal contours. Visualized tracheal air column is within normal limits. No pneumothorax.  IMPRESSION: Confluent right lung pneumonia and new reticulonodular density in the left lung compatible with progressive bilateral pneumonia.   Electronically Signed   By: Lars Pinks M.D.   On: 10/20/2013 11:47   Medications: I have reviewed the patient's current medications. Scheduled Meds: . amiodarone  200 mg Oral QPM  . antiseptic oral rinse  15 mL Mouth Rinse BID  . aspirin EC  81 mg Oral Daily  . atorvastatin  20 mg Oral q1800  . azithromycin  500 mg Oral Daily  . citalopram  20 mg Oral QPM  . heparin  5,000 Units Subcutaneous 3 times per day  . levothyroxine  50 mcg Oral QAC breakfast  . pantoprazole  40 mg Oral Daily  . piperacillin-tazobactam (ZOSYN)  IV  3.375 g Intravenous 3 times per day  . vancomycin  1,250 mg Intravenous Q24H   Continuous Infusions:  none PRN Meds:.albuterol  Assessment/Plan: Acute hypoxemic respiratory failure 2/2 to HCAP improving:  PO2 43.0 at admission and patient required BiPAP.  Respiratory failure 2/2 R pneumonia in the setting of acute HF exacerbation (elevated proBNP, crackles on exam).  CTA negative for PE.  ABG with pO2 44.5, CXR with progressive right lung airspace disease.  Increased oxygen requirement.  - venturi --> BiPAP - telemetry --> SDU - NPO while on BiPAP - continuous pulse ox monitoring - continue vancomycin, zosyn and azithromycin  - repeat CXR and ABG   NSTEMI:  Troponin 1.54 --> 2.15--> 1.82  --> 1.39.  No CP.  Likely demand in the setting of hypoxic respiratory failure and severe sepsis.  Cardiology consulted and recommendations appreciated.  Lipid panel: LDL 44, cholesterol 94.  10/19/13 Myoview reveals left apical infarct with associated hypokinesis and mild paradoxical motion within the distal anterior wall, possible per-infarct ishcemia involving the mid septum, EF 38%. - monitor on telemetry - cardiac cath deferred at this time given worsening renal function and etiology less likely acute ishcemia - continue ASA and statin  Severe sepsis, resolving:  Leukocytosis (21.2), tachypnea, lung source (HCAP) and lactic acidosis (4.04-->3.0).  BP low but stable, mostly 90s-110s SBP.  WBC 12.1--> 14.2 overnight. - treating HCAP as above - trend lactic acid    Acute on chronic combined HF, resolved: crackles, elevated proBNP.  Weight even since admission.  ~ net even. - No more Lasix at this time as he seems stable and Cr elevated - strict I&Os, daily weights    AKI on CKD3, resolved:  Baseline Cr 1.3.  Admission Cr 1.21 --> 1.40--> 1.55>>1.31.  Likely secondary to decreased po, low BP and contrast (for CTA). - monitor BMP  Atrial fibrillation:  NSR, rate controlled.  Xarelto on hold since 10/08/13 in the setting of GI bleed. - continue amiodarone - will resume Xarelto as outpatient   Hypothyroidism:  continue synthroid  Anemia 2/2 to LGIB:  Patient admitted with GI bleed on 05/07-05/11/15 and found to have several AVMs on colonoscopy which were ablated with APC.  His hgb was 10.4 at  d/c (improved from 8.0).  Hgb 12.0 --> 11.5 >> 9.8--> 11.2 this admission, no bleeding.   - monitor CBC and for S&S of bleeding - continue Protonix  Diet: NPO while on BiPAP VTE ppx:  Heparin [place], SCDs Code:  DNR  Dispo: Disposition is deferred at this time, awaiting improvement of current medical problems.  Anticipated discharge in approximately 2-3 day(s).   The patient does have a current PCP  Leamon Arnt, MD) and does not need an [facility] hospital follow-up appointment after discharge.  The patient does not know have transportation limitations that hinder transportation to clinic appointments.  .Services Needed at time of discharge: Y = Yes, Blank = No PT:   OT:   RN:   Equipment:   Other:     LOS: 6 days   [name], DO 10/20/2013, 1:01 PM

## 2013-10-20 NOTE — Progress Notes (Signed)
S: Went to evaluate Wesley Wang and found to have desat into low 60s on RA as he was trying to eat. Put on NRB at 55% and 15L and only O2 sats in the lows 80s. Pt expressed that he was "getting tired to breathe." there was open discussion where the patient volunteered the information that he WILL want intubation for his respiratory distress.   O:  Filed Vitals:   10/20/13 1030  BP: 116/63  Pulse: 80  Temp:   Resp: 22   General: resting in bed, uncomfortable, tired appearing Cardiac: RRR, no rubs, murmurs or gallops Pulm: using accessory muscles, decreased and dry rales on right lung fields, slight wheeze on left, tachypneic, moving decreased volumes, using pursed lips occasionally Abd: soft, nontender, nondistended, BS present Ext: warm and well perfused, no pedal edema Neuro: alert and oriented X3, cranial nerves II-XII grossly intact  A/P: Pt in continued respiratory distress. Stat ABG obtained and Bipap.  -transfer to stepdown - ABG 7.45/54.6/30.4 therefore patient seems to be compensating well just getting tired therefor Bipap continued -it should be made clear that patient DOES want intubation but does not want CPR -repeat ABG in 4-5 hrs based on respiratory status pt seemed to be feeling more comfortable on Bipap -stat CXR -continue to monitor the patient

## 2013-10-20 NOTE — Progress Notes (Signed)
Pt. Kept pulling out bipap on and off. Explained the purpose of bipap and ameanable.

## 2013-10-20 NOTE — Progress Notes (Signed)
Transferred in from [facility] by bed awake and alert with BIPAP. Breathing unlabored and regular, finished whole sentence. Continue to monitor.

## 2013-10-20 NOTE — Progress Notes (Signed)
Patient Name: Wesley Wang Date of Encounter: 10/20/2013     Principal Problem:   HCAP (healthcare-associated pneumonia) Active Problems:   CAD (coronary artery disease)   Atrial fibrillation   GI bleed   Acute on chronic renal failure   HLD (hyperlipidemia)   Adult hypothyroidism   Angiodysplasia of colon   Acute respiratory failure with hypoxia   Severe sepsis   NSTEMI (non-ST elevated myocardial infarction)   Acute on chronic combined systolic and diastolic heart failure   Acute respiratory failure   Bronchiectasis    SUBJECTIVE  No CP or pain anywhere. Just respiratory distress now much better on BIPAP.  CURRENT MEDS . amiodarone  200 mg Oral QPM  . antiseptic oral rinse  15 mL Mouth Rinse BID  . aspirin EC  81 mg Oral Daily  . atorvastatin  20 mg Oral q1800  . azithromycin  500 mg Oral Daily  . citalopram  20 mg Oral QPM  . heparin  5,000 Units Subcutaneous 3 times per day  . levothyroxine  50 mcg Oral QAC breakfast  . pantoprazole  40 mg Oral Daily  . piperacillin-tazobactam (ZOSYN)  IV  3.375 g Intravenous 3 times per day  . vancomycin  1,250 mg Intravenous Q24H    OBJECTIVE  Filed Vitals:   10/20/13 1000 10/20/13 1015 10/20/13 1030 10/20/13 1213  BP: 130/73 113/54 116/63 116/81  Pulse: 86 79 80 89  Temp:      TempSrc:      Resp: 23 23 22 27   Height:      Weight:      SpO2: 96% 97% 96% 94%    Intake/Output Summary (Last 24 hours) at 10/20/13 1222 Last data filed at 10/20/13 0827  Gross per 24 hour  Intake    240 ml  Output    301 ml  Net    -61 ml   Filed Weights   10/18/13 0503 10/19/13 0711 10/20/13 0615  Weight: 174 lb 9.7 oz (79.2 kg) 173 lb 13.3 oz (78.85 kg) 175 lb 8 oz (79.606 kg)    PHYSICAL EXAM  General: Pleasant, NAD. ON bipap Neuro: Alert and oriented X 3. Moves all extremities spontaneously. Psych: Normal affect. HEENT:  Normal  Neck: Supple without bruits or JVD. Lungs:  Resp regular and unlabored, Right lung with  crackles and rhonchi. Heart: RRR no s3, s4, or murmurs. Abdomen: Soft, non-tender, non-distended, BS + x 4.  Extremities: No clubbing, cyanosis or edema. DP/PT/Radials 2+ and equal bilaterally.  Accessory Clinical Findings  CBC  Recent Labs  10/19/13 0437 10/20/13 0505  WBC 12.1* 14.2*  NEUTROABS  --  12.0*  HGB 9.8* 11.2*  HCT 31.8* 36.4*  MCV 91.6 92.6  PLT 319 213   Basic Metabolic Panel  Recent Labs  10/19/13 0437 10/20/13 0505  NA 138 145  K 3.1* 4.4  CL 100 110  CO2 26 22  GLUCOSE 85 81  BUN 16 14  CREATININE 1.36* 1.31  CALCIUM 7.6* 8.1*    TELE  NSR with freq PACs  Radiology/Studies  Dg Chest 2 View  10/19/2013   CLINICAL DATA:  Shortness of breath.  EXAM: CHEST  2 VIEW  COMPARISON:  PA and lateral chest x-ray of Oct 15, 2013  FINDINGS: Since the previous study the right hemithorax has become nearly opacified by confluent alveolar densities and likely by pleural fluid. There is no shift of the mediastinum. The left lung is well-expanded and clear. The left heart border appears  normal. There is tortuosity of the descending thoracic aorta.  IMPRESSION: Confluent density throughout the you right lung may reflect atelectasis or pneumonia but there is no shift of the mediastinum. There is likely pleural fluid present as well. These findings have markedly progressed since the study of May 14th.   Electronically Signed   By: David  [place]   On: 10/19/2013 14:22   Dg Chest 2 View  10/15/2013   CLINICAL DATA:  Shortness of breath, respiratory failure  EXAM: CHEST  2 VIEW  COMPARISON:  2013/11/09.  FINDINGS: There is patchy infiltrate/ pneumonia in right lung. Small right pleural effusion. Left lung is clear. Minimal degenerative changes thoracic spine. No pulmonary edema.  IMPRESSION: Patchy infiltrate/ pneumonia in right lung. Small right pleural effusion. No pulmonary edema.   Electronically Signed   By: Lahoma Crocker M.D.   On: 10/15/2013 08:04    Nm Myocar Multi  W/spect W/wall Motion / Ef  10/19/2013   CLINICAL DATA:  Chest pain.  Pneumonia.  EXAM: MYOCARDIAL IMAGING WITH SPECT (REST AND PHARMACOLOGIC-STRESS)  GATED LEFT VENTRICULAR WALL MOTION STUDY  LEFT VENTRICULAR EJECTION FRACTION  TECHNIQUE: Standard myocardial SPECT imaging was performed after resting intravenous injection of 10 mCi Tc-34m sestamibi. Subsequently, intravenous infusion of Lexiscan was performed under the supervision of the Cardiology staff. At peak effect of the drug, 30 mCi Tc-70m sestamibi was injected intravenously and standard myocardial SPECT imaging was performed. Quantitative gated imaging was also performed to evaluate left ventricular wall motion, and estimate left ventricular ejection fraction.  COMPARISON:  Radiographs 10/15/2013.  CT 2013-11-09.  FINDINGS: The left ventricle is dilated. There is a large predominantly fixed scar involving the apex and all distal myocardial segments. Within the adjacent septum, mild reversibility is demonstrated with a summed difference score of 6.  The QGS ejection fraction calculated at rest is 38% with an end-diastolic volume of 852DP and an end-systolic volume of 82UM. There is apical hypokinesis with mild paradoxical motion within the distal anterior wall.  IMPRESSION: 1. Left apical infarct with associated hypokinesis and mild paradoxical motion within the distal anterior wall. 2. Possible peri-infarct ischemia involving the mid septum. 3. Left ventricular ejection fraction is 38%.   Electronically Signed   By: Camie Patience M.D.   On: 10/19/2013 16:34   Dg Chest Port 1 View  10/20/2013   CLINICAL DATA:  80 year old male with respiratory failure. Hypoxia. Initial encounter.  EXAM: PORTABLE CHEST - 1 VIEW  COMPARISON:  10/19/2013 and earlier.  FINDINGS: Portable AP semi upright view at 1123 hrs. Continued near complete opacification of the right hemi thorax with volume loss and some rightward shift of the mediastinum. New left perihilar and basilar  reticular nodular density in the left lung. Stable cardiac size and mediastinal contours. Visualized tracheal air column is within normal limits. No pneumothorax.  IMPRESSION: Confluent right lung pneumonia and new reticulonodular density in the left lung compatible with progressive bilateral pneumonia.   Electronically Signed   By: Lars Pinks M.D.   On: 10/20/2013 11:47    ASSESSMENT AND PLAN  80 yo male with mild dementia, non-obstructive CAD from cath >10 years ago, chronic systolic heart failure LVEF 35%, afib on xarelto but held due to recent GI bleed, CKD who was admitted to [facility] on 2013/11/09 with CAP and GI bleed.   Acute respiratory distress - he was transferred to step down with increased work of breathing and decreased O2 sats.  -- ABG 7.45/54.6/30.4 therefore patient seems to be compensating  well just getting tired therefor Bipap continued  -- Patient DOES want intubation but does not want CPR  -- Stat CXR with extensive right lung pneumonia. On azithromycin and vanc/zosyn -- Being followed by PCCM  NSTEMI - peak 2.15, now trending down.  -- Thought likely to be demand ischemia based on prior notes in setting of pneumonia. He has not had any chest pain.  -- Underwent MPI yesterday which revealed                         1. Left apical infarct with associated hypokinesis and mild paradoxical motion within the distal anterior wall.                         2. Possible peri-infarct ischemia involving the mid septum.                         3. Left ventricular ejection fraction is 38%. -- Continue ASA and statin. No BB due to low BPs -- MD to see for further work up  Afib --now converted to NSR -- Continue amiodarone -- Anticoag on hold since last admission with GIB.  -- Maintained on amio, coreg has been on hold due to low bp's   Acute on chronic systolic/diastolic HF  -- Chronic meds held in setting of hypotension initially and BP improving -- Currently appears euvolemic.   AKI  --  Renal function improving    PNA  -- Abx per primary team   Hypokalemia- resolved.   Signed, Perry Mount PA-C  Pager [phone]  Agree with note by Lorretta Harp PA-C.  Pt transferred to [place] secondary to respiratory decompensation. His CXR has clearly evolved and worsened with close to complete white out of right lung. He is on BiPAP maintaining NSR with Sats 91% (FiO2 80%). He is coherent and is a limited Code (intubate but no CPR). His MPI showed lateral scar with peri infarct ischemia and an EF of 38% (c/w his 2D EF of 35% in past). His last BNP is >9000 and he is not on a diuretic. I think he would benefit from gentle diuresis in addition to ATBX for Rx of PNA as well as Pulm toilet. His renal fxn is stable. Will defer to the primary service. Once he reovers from his pulmonary process we can re address the need for cath to define his anatomy given his + MPI and S/O CP.   Lorretta Harp, M.D., [place], [facility], [place] [address] [place], [place]  [address]  [phone] 10/20/2013 3:28 PM

## 2013-10-20 NOTE — Consult Note (Signed)
PULMONARY / CRITICAL CARE MEDICINE  Name: Wesley Wang MRN: [id number] DOB: 1932/05/19    ADMISSION DATE:  10/30/2013 CONSULTATION DATE:  07/18/2013  REFERRING MD :  Dareen Piano PRIMARY SERVICE:  [facility]  CHIEF COMPLAINT:  Pneumonia?  BRIEF PATIENT DESCRIPTION: 80 yo recently treated for CAP and discharged 5/11 on Levaquin with residual dyspnea.  Returned on 5/13 with increased dyspnea.  In ED imaging demonstrated acute on chronic R lung  parenchymal disease, R effusion and bilateral atelectasis.  The patient was hypoxic, requiring BiPAP.  PCCM initially consulted and signed off 5/15.  Re-consulted again 5/18 and signed off.  Re-consulted again 5/19.  SIGNIFICANT EVENTS / STUDIES:  5/13  Chest CTA >>> No PE, acute on chronic R lung  parenchymal disease, R effusion and bilateral bronchiectasis 5/18  Myoview >>> left apical infarct with associated hypokinesis, EF 38%. 5/19  Transferred to SDU as needed BiPAP therapy and concern about tiring out.  LINES / TUBES:  CULTURES: Blood 5/14 >>>  ANTIBIOTICS: Vancomycin 5/13 >>> Zosyn  5/13 >>> Azithromycin 5/14 >>>  INTERVAL HISTORY:  Developed respiratory distress requiring BiPAP.  Transferred to SDU.  VITAL SIGNS: Temp:  [97.9 F (36.6 C)-98.8 F (37.1 C)] 98.4 F (36.9 C) (05/19 1240) Pulse Rate:  [79-100] 100 (05/19 1500) Resp:  [14-32] 32 (05/19 1500) BP: (100-130)/(54-81) 126/78 mmHg (05/19 1500) SpO2:  [87 %-100 %] 98 % (05/19 1500) FiO2 (%):  [60 %-100 %] 60 % (05/19 1213) Weight:  [79.606 kg (175 lb 8 oz)] 79.606 kg (175 lb 8 oz) (05/19 0615)  PHYSICAL EXAMINATION: General:  Pleasant male, resting in bed on BiPAP, in NAD. Neuro:  A&O x 3. No focal deficit. HEENT:  Moist membranes, BiPAP mask on. Cardiovascular:  Irregular, nomurmurs Lungs:  Decreased on right. Abdomen:  Soft, non tender, bowel sounds present Musculoskeletal:  Moves all extremities Skin:  Intact  LABS: CBC  Recent Labs Lab 10/18/13 0445  10/19/13 0437 10/20/13 0505  WBC 11.5* 12.1* 14.2*  HGB 11.5* 9.8* 11.2*  HCT 37.2* 31.8* 36.4*  PLT 321 319 370   Coag's  Recent Labs Lab 10/02/2013 1659  APTT 29  INR 1.30   BMET  Recent Labs Lab 10/18/13 0445 10/19/13 0437 10/20/13 0505  NA 141 138 145  K 4.0 3.1* 4.4  CL 102 100 110  CO2 27 26 22   BUN 23 16 14   CREATININE 1.34 1.36* 1.31  GLUCOSE 77 85 81   Electrolytes  Recent Labs Lab 10/13/2013 2252  10/18/13 0445 10/19/13 0437 10/20/13 0505  CALCIUM  --   < > 8.0* 7.6* 8.1*  MG 2.1  --   --   --   --   < > = values in this interval not displayed. Sepsis Markers  Recent Labs Lab 10/05/2013 1735 10/21/2013 2252 10/15/13 0315 10/20/2013 0254  LATICACIDVEN 4.04*  --   --  3.0*  PROCALCITON  --  0.11 0.13 0.12   ABG  Recent Labs Lab 10/19/13 1310 10/20/13 0845 10/20/13 1250  PHART 7.489* 7.459* 7.479*  PCO2ART 33.5* 30.4* 32.3*  PO2ART 44.5* 54.6* 51.2*   Liver Enzymes  Recent Labs Lab 10/20/2013 1659  AST 40*  ALT 21  ALKPHOS 81  BILITOT 1.5*  ALBUMIN 2.7*   Cardiac Enzymes  Recent Labs Lab 11/01/2013 1705 10/16/2013 2252 10/15/13 0315 10/15/13 1042  TROPONINI  --  2.15* 1.82* 1.39*  PROBNP 9922.0*  --   --   --    Glucose  Recent Labs Lab 10/18/13 2221  GLUCAP 132*   IMAGING: Dg Chest 2 View  10/19/2013   CLINICAL DATA:  Shortness of breath.  EXAM: CHEST  2 VIEW  COMPARISON:  PA and lateral chest x-ray of Oct 15, 2013  FINDINGS: Since the previous study the right hemithorax has become nearly opacified by confluent alveolar densities and likely by pleural fluid. There is no shift of the mediastinum. The left lung is well-expanded and clear. The left heart border appears normal. There is tortuosity of the descending thoracic aorta.  IMPRESSION: Confluent density throughout the you right lung may reflect atelectasis or pneumonia but there is no shift of the mediastinum. There is likely pleural fluid present as well. These findings have  markedly progressed since the study of May 14th.   Electronically Signed   By: David  Martinique   On: 10/19/2013 14:22   Nm Myocar Multi W/spect W/wall Motion / Ef  10/19/2013   CLINICAL DATA:  Chest pain.  Pneumonia.  EXAM: MYOCARDIAL IMAGING WITH SPECT (REST AND PHARMACOLOGIC-STRESS)  GATED LEFT VENTRICULAR WALL MOTION STUDY  LEFT VENTRICULAR EJECTION FRACTION  TECHNIQUE: Standard myocardial SPECT imaging was performed after resting intravenous injection of 10 mCi Tc-54m sestamibi. Subsequently, intravenous infusion of Lexiscan was performed under the supervision of the Cardiology staff. At peak effect of the drug, 30 mCi Tc-58m sestamibi was injected intravenously and standard myocardial SPECT imaging was performed. Quantitative gated imaging was also performed to evaluate left ventricular wall motion, and estimate left ventricular ejection fraction.  COMPARISON:  Radiographs 10/15/2013.  CT 10/24/2013.  FINDINGS: The left ventricle is dilated. There is a large predominantly fixed scar involving the apex and all distal myocardial segments. Within the adjacent septum, mild reversibility is demonstrated with a summed difference score of 6.  The QGS ejection fraction calculated at rest is 38% with an end-diastolic volume of 409WJ and an end-systolic volume of 19JY. There is apical hypokinesis with mild paradoxical motion within the distal anterior wall.  IMPRESSION: 1. Left apical infarct with associated hypokinesis and mild paradoxical motion within the distal anterior wall. 2. Possible peri-infarct ischemia involving the mid septum. 3. Left ventricular ejection fraction is 38%.   Electronically Signed   By: Camie Patience M.D.   On: 10/19/2013 16:34   Dg Chest Port 1 View  10/20/2013   CLINICAL DATA:  80 year old male with respiratory failure. Hypoxia. Initial encounter.  EXAM: PORTABLE CHEST - 1 VIEW  COMPARISON:  10/19/2013 and earlier.  FINDINGS: Portable AP semi upright view at 1123 hrs. Continued near  complete opacification of the right hemi thorax with volume loss and some rightward shift of the mediastinum. New left perihilar and basilar reticular nodular density in the left lung. Stable cardiac size and mediastinal contours. Visualized tracheal air column is within normal limits. No pneumothorax.  IMPRESSION: Confluent right lung pneumonia and new reticulonodular density in the left lung compatible with progressive bilateral pneumonia.   Electronically Signed   By: Lars Pinks M.D.   On: 10/20/2013 11:47   ASSESSMENT / PLAN:   Acute hypoxemic respiratory failure  Possible aspiration pneumonia vs HCAP, likely chronic as dysphagia reported since about 6 month ago ? Right pleural effusion  Asymmetric pulmonary edema / pneumonitis unlikely Pulmonary embolism ruled out DNI / DNR status    Continue supplemental oxygen with goal SpO2 88 - 92%  Oxygenation is likely to improve if placed "good" (left) lung down  Continue Vancomycin/Zosyn/Zithromax.  Aspiration precautions  Repeat chest CT / bronchoscopy may be potentially beneficial, but the  patient is unlikely to tolerate it unless intubated first   Once again discussed goals of care with Mr. Gopal.  While it appears reasonable to intervene aggressively in this potentially reversible  condition, he is concerned that the underlying problem of chronic aspiration may not be fixed even if the aggressive care is provided and  that once intubated he may not be able to be liberated form the mechanical ventilation.  He is still leaning towards NOT being intubated;  although, he would like to wait until his son arrives this evening to make his final decision.    Montey Hora, [facility] Pgr: [phone]  or [phone]  I have personally obtained history, examined patient, evaluated and interpreted laboratory and imaging results, reviewed medical records, formulated assessment / plan and placed orders.  Doree Fudge, MD Pulmonary and [place] Pager: [phone]  10/20/2013, 4:34 PM

## 2013-10-20 NOTE — Progress Notes (Signed)
Bipap kept alarming, Respiratory therapist aware and came to fix.

## 2013-10-21 DIAGNOSIS — E785 Hyperlipidemia, unspecified: Secondary | ICD-10-CM

## 2013-10-21 DIAGNOSIS — D62 Acute posthemorrhagic anemia: Secondary | ICD-10-CM

## 2013-10-21 LAB — CBC WITH DIFFERENTIAL/PLATELET
BASOS ABS: 0 10*3/uL (ref 0.0–0.1)
Basophils Relative: 0 % (ref 0–1)
Eosinophils Absolute: 0 10*3/uL (ref 0.0–0.7)
Eosinophils Relative: 0 % (ref 0–5)
HCT: 40.7 % (ref 39.0–52.0)
Hemoglobin: 12.8 g/dL — ABNORMAL LOW (ref 13.0–17.0)
Lymphocytes Relative: 4 % — ABNORMAL LOW (ref 12–46)
Lymphs Abs: 0.8 10*3/uL (ref 0.7–4.0)
MCH: 29.2 pg (ref 26.0–34.0)
MCHC: 31.4 g/dL (ref 30.0–36.0)
MCV: 92.7 fL (ref 78.0–100.0)
MONO ABS: 1.6 10*3/uL — AB (ref 0.1–1.0)
MONOS PCT: 8 % (ref 3–12)
Neutro Abs: 17.6 10*3/uL — ABNORMAL HIGH (ref 1.7–7.7)
Neutrophils Relative %: 88 % — ABNORMAL HIGH (ref 43–77)
PLATELETS: 343 10*3/uL (ref 150–400)
RBC: 4.39 MIL/uL (ref 4.22–5.81)
RDW: 16.1 % — AB (ref 11.5–15.5)
WBC: 20 10*3/uL — AB (ref 4.0–10.5)

## 2013-10-21 LAB — BASIC METABOLIC PANEL
BUN: 12 mg/dL (ref 6–23)
CALCIUM: 8.1 mg/dL — AB (ref 8.4–10.5)
CO2: 21 meq/L (ref 19–32)
Chloride: 107 mEq/L (ref 96–112)
Creatinine, Ser: 1.21 mg/dL (ref 0.50–1.35)
GFR calc Af Amer: 63 mL/min — ABNORMAL LOW (ref >90)
GFR, EST NON AFRICAN AMERICAN: 55 mL/min — AB (ref >90)
Glucose, Bld: 100 mg/dL — ABNORMAL HIGH (ref 70–99)
Potassium: 4.1 mEq/L (ref 3.7–5.3)
Sodium: 145 mEq/L (ref 137–147)

## 2013-10-21 LAB — PRO B NATRIURETIC PEPTIDE: Pro B Natriuretic peptide (BNP): 10743 pg/mL — ABNORMAL HIGH (ref 0–450)

## 2013-10-21 LAB — VANCOMYCIN, TROUGH: Vancomycin Tr: 15.6 ug/mL (ref 10.0–20.0)

## 2013-10-21 MED ORDER — MORPHINE SULFATE 2 MG/ML IJ SOLN
2.0000 mg | INTRAMUSCULAR | Status: DC | PRN
  Administered 2013-10-21: 2 mg via INTRAVENOUS

## 2013-10-21 MED ORDER — FUROSEMIDE 10 MG/ML IJ SOLN
40.0000 mg | Freq: Two times a day (BID) | INTRAMUSCULAR | Status: DC
  Administered 2013-10-21 – 2013-10-22 (×2): 40 mg via INTRAVENOUS
  Filled 2013-10-21 (×4): qty 4

## 2013-10-21 MED ORDER — FUROSEMIDE 40 MG PO TABS: 40.0000 mg | ORAL_TABLET | Freq: Two times a day (BID) | ORAL | Status: DC

## 2013-10-21 MED ORDER — MORPHINE SULFATE 2 MG/ML IJ SOLN
2.0000 mg | INTRAMUSCULAR | Status: DC | PRN
  Administered 2013-10-21 – 2013-10-22 (×3): 2 mg via INTRAVENOUS
  Filled 2013-10-21 (×3): qty 1

## 2013-10-21 MED ORDER — MORPHINE SULFATE 2 MG/ML IJ SOLN
INTRAMUSCULAR | Status: AC
  Filled 2013-10-21: qty 1

## 2013-10-21 MED ORDER — MORPHINE SULFATE 2 MG/ML IJ SOLN
1.0000 mg | INTRAMUSCULAR | Status: DC | PRN
  Administered 2013-10-21: 1 mg via INTRAVENOUS
  Filled 2013-10-21: qty 1

## 2013-10-21 MED ORDER — MORPHINE SULFATE 2 MG/ML IJ SOLN
1.0000 mg | Freq: Once | INTRAMUSCULAR | Status: AC
  Administered 2013-10-21: 1 mg via INTRAVENOUS
  Filled 2013-10-21: qty 1

## 2013-10-21 NOTE — Progress Notes (Signed)
PULMONARY / CRITICAL CARE MEDICINE  Name: Wesley Wang MRN: [id number] DOB: 04-13-1932    ADMISSION DATE:  11/01/2013 CONSULTATION DATE:  07/18/2013  REFERRING MD :  Dareen Piano PRIMARY SERVICE:  [facility]  CHIEF COMPLAINT:  Pneumonia?  BRIEF PATIENT DESCRIPTION: 80 yo recently treated for CAP and discharged 5/11 on Levaquin with residual dyspnea.  Returned on 5/13 with increased dyspnea.  In ED imaging demon started acute on chronic R lung  parenchymal disease, R effusion and bilateral atelectasis.  The patient was hypoxic, requiring BiPAP.  PCCM was consulted. PCCM signed off on 5/15.  Pt had myoview 5/18 and became hypoxic once again.  CXR showed worsening of right lung density.  PCCM re-consulted.  SIGNIFICANT EVENTS / STUDIES:  5/13  Chest CTA >>> No PE, acute on chronic R lung  parenchymal disease, R effusion and bilateral bronchiectasis  5/15 PCCM signed off 5/18 Myoview >>> pt became hypoxic with SpO2 into 70's.  CXR with progression of right sided density PCCM reconsulted.  LINES / TUBES: PIV  CULTURES: Blood 5/14>>>  ANTIBIOTICS: Vancomycin 5/13 >>> Zosyn  5/13 >>> Azithromycin 5/14 >>>  SUBJECTIVE: Became hypoxic while in myoview study earlier, SpO2 into 70s.  VITAL SIGNS: Temp:  [97.3 F (36.3 C)-100.3 F (37.9 C)] 97.4 F (36.3 C) (05/20 1222) Pulse Rate:  [80-108] 80 (05/20 1250) Resp:  [18-36] 23 (05/20 1250) BP: (118-143)/(59-83) 122/83 mmHg (05/20 1250) SpO2:  [75 %-100 %] 90 % (05/20 1250) FiO2 (%):  [0.8 %-80 %] 80 % (05/20 1248) Weight:  [173 lb 8 oz (78.7 kg)] 173 lb 8 oz (78.7 kg) (05/20 0500)  PHYSICAL EXAMINATION: General:  Pleasant male, resting in bed on BiPAP. Neuro:  A&O x 3. HEENT:  [place]/AT. Cardiovascular:  IRIR, no M/R/G. Lungs:  Decreased BS on right.  No W/R/R. Abdomen:  Soft, non tender, bowel sounds present Musculoskeletal:  Moves all extremities Skin:  Intact  LABS: CBC  Recent Labs Lab 10/19/13 0437 10/20/13 0505 10/21/13 0546   WBC 12.1* 14.2* 20.0*  HGB 9.8* 11.2* 12.8*  HCT 31.8* 36.4* 40.7  PLT 319 370 343   Coag's  Recent Labs Lab 10/06/2013 1659  APTT 29  INR 1.30   BMET  Recent Labs Lab 10/19/13 0437 10/20/13 0505 10/21/13 0546  NA 138 145 145  K 3.1* 4.4 4.1  CL 100 110 107  CO2 26 22 21   BUN 16 14 12   CREATININE 1.36* 1.31 1.21  GLUCOSE 85 81 100*   Electrolytes  Recent Labs Lab  2252  10/19/13 0437 10/20/13 0505 10/21/13 0546  CALCIUM  --   < > 7.6* 8.1* 8.1*  MG 2.1  --   --   --   --   < > = values in this interval not displayed. Sepsis Markers  Recent Labs Lab 10/18/2013 1735 10/31/2013 2252 10/15/13 0315 11/14/2013 0254  LATICACIDVEN 4.04*  --   --  3.0*  PROCALCITON  --  0.11 0.13 0.12   ABG  Recent Labs Lab 10/19/13 1310 10/20/13 0845 10/20/13 1250  PHART 7.489* 7.459* 7.479*  PCO2ART 33.5* 30.4* 32.3*  PO2ART 44.5* 54.6* 51.2*   Liver Enzymes  Recent Labs Lab 10/13/2013 1659  AST 40*  ALT 21  ALKPHOS 81  BILITOT 1.5*  ALBUMIN 2.7*   Cardiac Enzymes  Recent Labs Lab 10/11/2013 1705 10/13/2013 2252 10/15/13 0315 10/15/13 1042 10/21/13 0546  TROPONINI  --  2.15* 1.82* 1.39*  --   PROBNP 9922.0*  --   --   --  10743.0*   Glucose  Recent Labs Lab 10/18/13 2221  GLUCAP 132*   IMAGING: Dg Chest 2 View  10/19/2013   CLINICAL DATA:  Shortness of breath.  EXAM: CHEST  2 VIEW  COMPARISON:  PA and lateral chest x-ray of Oct 15, 2013  FINDINGS: Since the previous study the right hemithorax has become nearly opacified by confluent alveolar densities and likely by pleural fluid. There is no shift of the mediastinum. The left lung is well-expanded and clear. The left heart border appears normal. There is tortuosity of the descending thoracic aorta.  IMPRESSION: Confluent density throughout the you right lung may reflect atelectasis or pneumonia but there is no shift of the mediastinum. There is likely pleural fluid present as well. These findings have  markedly progressed since the study of May 14th.   Electronically Signed   By: David  Martinique   On: 10/19/2013 14:22   Nm Myocar Multi W/spect W/wall Motion / Ef  10/19/2013   CLINICAL DATA:  Chest pain.  Pneumonia.  EXAM: MYOCARDIAL IMAGING WITH SPECT (REST AND PHARMACOLOGIC-STRESS)  GATED LEFT VENTRICULAR WALL MOTION STUDY  LEFT VENTRICULAR EJECTION FRACTION  TECHNIQUE: Standard myocardial SPECT imaging was performed after resting intravenous injection of 10 mCi Tc-32m sestamibi. Subsequently, intravenous infusion of Lexiscan was performed under the supervision of the Cardiology staff. At peak effect of the drug, 30 mCi Tc-39m sestamibi was injected intravenously and standard myocardial SPECT imaging was performed. Quantitative gated imaging was also performed to evaluate left ventricular wall motion, and estimate left ventricular ejection fraction.  COMPARISON:  Radiographs 10/15/2013.  CT 10/25/2013.  FINDINGS: The left ventricle is dilated. There is a large predominantly fixed scar involving the apex and all distal myocardial segments. Within the adjacent septum, mild reversibility is demonstrated with a summed difference score of 6.  The QGS ejection fraction calculated at rest is 38% with an end-diastolic volume of 160VP and an end-systolic volume of 71GG. There is apical hypokinesis with mild paradoxical motion within the distal anterior wall.  IMPRESSION: 1. Left apical infarct with associated hypokinesis and mild paradoxical motion within the distal anterior wall. 2. Possible peri-infarct ischemia involving the mid septum. 3. Left ventricular ejection fraction is 38%.   Electronically Signed   By: Camie Patience M.D.   On: 10/19/2013 16:34   Dg Chest Port 1 View  10/20/2013   CLINICAL DATA:  80 year old male with respiratory failure. Hypoxia. Initial encounter.  EXAM: PORTABLE CHEST - 1 VIEW  COMPARISON:  10/19/2013 and earlier.  FINDINGS: Portable AP semi upright view at 1123 hrs. Continued near  complete opacification of the right hemi thorax with volume loss and some rightward shift of the mediastinum. New left perihilar and basilar reticular nodular density in the left lung. Stable cardiac size and mediastinal contours. Visualized tracheal air column is within normal limits. No pneumothorax.  IMPRESSION: Confluent right lung pneumonia and new reticulonodular density in the left lung compatible with progressive bilateral pneumonia.   Electronically Signed   By: Lars Pinks M.D.   On: 10/20/2013 11:47   ASSESSMENT / PLAN:   Acute hypoxemic respiratory failure Possible aspiration pneumonia vs HCAP - likely chronic, pt reports having trouble with swallowing for past 6 months. ? Right pleural effusion DNI / DNR status - Continue supplemental oxygen with goal SpO2 88 - 92%. - Continue Vancomycin/Zosyn/Zithromax - Pulmonary Hygiene. - Swallow precautions per SLP recs. - I again confirmed code status discussion, the patient is chronically aspirating, the pneumonia is very unlikely to  improve.  I agree with BiPAP for comfort purposes only.  At this point I recommend consulting with palliative care medicine as if foresee the patient continuing to deteriorate.  Bronchoscopy has no role here as I highly doubt a mucous plug would persist for that long in the same location this is his aspiration preventing any improvement in pneumonia.  PCCM will sign off.  Please do not hesitate to call back if we can be of any further assistance.  Rush Farmer, M.D. [facility] Pulmonary/Critical Care Medicine. Pager: [phone]. After hours pager: [phone].

## 2013-10-21 NOTE — Progress Notes (Addendum)
ANTIBIOTIC CONSULT NOTE - FOLLOW UP  Pharmacy Consult for vancomycin Indication: HCAP/sepsis  Allergies  Allergen Reactions  . Sulfa Antibiotics Hives    Patient Measurements: Height: 5\' 7"  (170.2 cm) Weight: 173 lb 8 oz (78.7 kg) IBW/kg (Calculated) : 66.1  Vital Signs: Temp: 98 F (36.7 C) (05/20 0730) Temp src: Axillary (05/20 0730) BP: 129/78 mmHg (05/20 0730) Pulse Rate: 84 (05/20 0730) Intake/Output from previous day: 05/19 0701 - 05/20 0700 In: 50 [IV Piggyback:50] Out: 528 [Urine:525; Stool:3] Intake/Output from this shift:    Labs:  Recent Labs  10/19/13 0437 10/20/13 0505 10/21/13 0546  WBC 12.1* 14.2* 20.0*  HGB 9.8* 11.2* 12.8*  PLT 319 370 343  CREATININE 1.36* 1.31 1.21   Estimated Creatinine Clearance: 45.5 ml/min (by C-G formula based on Cr of 1.21).  Recent Labs  10/21/13 0546  VANCOTROUGH 15.6     Microbiology: Recent Results (from the past 720 hour(s))  MRSA PCR SCREENING     Status: None   Collection Time    10/08/13  7:23 PM      Result Value Ref Range Status   MRSA by PCR NEGATIVE  NEGATIVE Final   Comment:            The GeneXpert MRSA Assay (FDA     approved for NASAL specimens     only), is one component of a     comprehensive MRSA colonization     surveillance program. It is not     intended to diagnose MRSA     infection nor to guide or     monitor treatment for     MRSA infections.  CULTURE, BLOOD (ROUTINE X 2)     Status: None   Collection Time    10/07/2013  6:00 PM      Result Value Ref Range Status   Specimen Description BLOOD LEFT FOREARM   Final   Special Requests BOTTLES DRAWN AEROBIC AND ANAEROBIC [facility]   Final   Culture  Setup Time     Final   Value: 10/13/2013 22:59     Performed at [facility]   Culture     Final   Value: NO GROWTH 5 DAYS     Performed at [facility]   Report Status 10/20/2013 FINAL   Final  CULTURE, BLOOD (ROUTINE X 2)     Status: None   Collection Time    10/29/2013   6:10 PM      Result Value Ref Range Status   Specimen Description BLOOD ARM RIGHT   Final   Special Requests BOTTLES DRAWN AEROBIC AND ANAEROBIC 5CC   Final   Culture  Setup Time     Final   Value: 10/31/2013 22:59     Performed at [facility]   Culture     Final   Value: NO GROWTH 5 DAYS     Performed at [facility]   Report Status 10/20/2013 FINAL   Final  MRSA PCR SCREENING     Status: None   Collection Time    10/15/13  4:40 PM      Result Value Ref Range Status   MRSA by PCR NEGATIVE  NEGATIVE Final   Comment:            The GeneXpert MRSA Assay (FDA     approved for NASAL specimens     only), is one component of a     comprehensive MRSA colonization  surveillance program. It is not     intended to diagnose MRSA     infection nor to guide or     monitor treatment for     MRSA infections.    Anti-infectives   Start     Dose/Rate Route Frequency Ordered Stop   10/18/13 0600  azithromycin (ZITHROMAX) tablet 500 mg     500 mg Oral Daily 10/17/13 1609     10/17/13 0600  vancomycin (VANCOCIN) 1,250 mg in sodium chloride 0.9 % 250 mL IVPB     1,250 mg 166.7 mL/hr over 90 Minutes Intravenous Every 24 hours 10/29/2013 1138     10/15/13 0700  vancomycin (VANCOCIN) IVPB 750 mg/150 ml premix  Status:  Discontinued     750 mg 150 mL/hr over 60 Minutes Intravenous Every 12 hours 2013/10/20 2302 10/25/2013 1138   10/15/13 0600  piperacillin-tazobactam (ZOSYN) IVPB 3.375 g     3.375 g 12.5 mL/hr over 240 Minutes Intravenous 3 times per day Oct 20, 2013 2302     10/15/13 0245  azithromycin (ZITHROMAX) 500 mg in dextrose 5 % 250 mL IVPB  Status:  Discontinued     500 mg 250 mL/hr over 60 Minutes Intravenous Every 24 hours 10/15/13 0233 10/17/13 1609   Oct 20, 2013 1800  piperacillin-tazobactam (ZOSYN) IVPB 3.375 g     3.375 g 12.5 mL/hr over 240 Minutes Intravenous  Once 20-Oct-2013 1750 2013-10-20 1853   10-20-13 1800  vancomycin (VANCOCIN) IVPB 1000 mg/200 mL premix     1,000  mg 200 mL/hr over 60 Minutes Intravenous  Once 20-Oct-2013 1750 10/20/2013 2006      Assessment: 80 yo male with PNA on day 7 vancomycin, zosyn and day 6 azithromycin. WBC= 10, tmax= 100.3, SCr= 1.21 and CrCl ~ 45.  A vancomycin trough this am was 15.6 and at goal.   Vanc 5/13>> Zosyn 5/13>> Azith 5/14>>  5/13 BCx >> neg MRSA pcr neg   Goal of Therapy:  Vancomycin trough level 15-20 mcg/ml  Plan:  -No antibiotic dose changes needed -Consider defining length of therapy? -Will follow renal function and clinical progress  Hildred Laser, Pharm D 10/21/2013 7:43 AM

## 2013-10-21 NOTE — Progress Notes (Signed)
Subjective: Increased oxygen requirement and tiring out on venti mask so switched to BiPAP yesterday.  He seems to be tiring on BiPAP.  When asked about intubation he defered to his son.  His son, Dr. Beryle Beams and I discussed the patient's situation and he says his father would not to be intubated on a vent.  He will discuss the current situation with his family and update [place] with changes.  For now the patient will continue with BiPAP as long as he wishes, but no plan for intubation.  Objective: Vital signs in last 24 hours: Filed Vitals:   10/21/13 0500 10/21/13 0730 10/21/13 0920 10/21/13 0921  BP:  129/78 125/64   Pulse:  84 99 100  Temp:  98 F (36.7 C)    TempSrc:  Axillary    Resp:  29 25 24   Height:      Weight: 78.7 kg (173 lb 8 oz)     SpO2:  92% 90% 90%   Weight change: -0.15 kg (-5.3 oz)  Intake/Output Summary (Last 24 hours) at 10/21/13 1027 Last data filed at 10/21/13 0600  Gross per 24 hour  Intake     50 ml  Output    527 ml  Net   -477 ml   General: resting in bed in mild distress HEENT:  BiPAP mask in place Cardiac: RRR, no rubs, murmurs or gallops Pulm: coarse sounds on right but difficult to hear over BiPAP machine; + accessory muscle use, able to speak; SpO2 89-95% on 80% via BiPAP Abd: soft, nontender, BS present, minimal distention Ext: warm and well perfused, no pedal edema Neuro: alert and oriented X3, responding appropriately, able to move all four extremities voluntarily  Lab Results: Basic Metabolic Panel:  Recent Labs Lab 10/17/2013 2252  10/20/13 0505 10/21/13 0546  NA  --   < > 145 145  K  --   < > 4.4 4.1  CL  --   < > 110 107  CO2  --   < > 22 21  GLUCOSE  --   < > 81 100*  BUN  --   < > 14 12  CREATININE  --   < > 1.31 1.21  CALCIUM  --   < > 8.1* 8.1*  MG 2.1  --   --   --   < > = values in this interval not displayed. CBC:  Recent Labs Lab 10/20/13 0505 10/21/13 0546  WBC 14.2* 20.0*  NEUTROABS 12.0* 17.6*  HGB 11.2*  12.8*  HCT 36.4* 40.7  MCV 92.6 92.7  PLT 370 343   Cardiac Enzymes:  Recent Labs Lab 10/07/2013 2252 10/15/13 0315 10/15/13 1042  TROPONINI 2.15* 1.82* 1.39*   Micro Results: Recent Results (from the past 240 hour(s))  CULTURE, BLOOD (ROUTINE X 2)     Status: None   Collection Time    10/17/2013  6:00 PM      Result Value Ref Range Status   Specimen Description BLOOD LEFT FOREARM   Final   Special Requests BOTTLES DRAWN AEROBIC AND ANAEROBIC [facility]   Final   Culture  Setup Time     Final   Value: 10/20/2013 22:59     Performed at [facility]   Culture     Final   Value: NO GROWTH 5 DAYS     Performed at [facility]   Report Status 10/20/2013 FINAL   Final  CULTURE, BLOOD (ROUTINE X 2)     Status:  None   Collection Time    11/01/2013  6:10 PM      Result Value Ref Range Status   Specimen Description BLOOD ARM RIGHT   Final   Special Requests BOTTLES DRAWN AEROBIC AND ANAEROBIC 5CC   Final   Culture  Setup Time     Final   Value: 11/01/2013 22:59     Performed at [facility]   Culture     Final   Value: NO GROWTH 5 DAYS     Performed at [facility]   Report Status 10/20/2013 FINAL   Final  MRSA PCR SCREENING     Status: None   Collection Time    10/15/13  4:40 PM      Result Value Ref Range Status   MRSA by PCR NEGATIVE  NEGATIVE Final   Comment:            The GeneXpert MRSA Assay (FDA     approved for NASAL specimens     only), is one component of a     comprehensive MRSA colonization     surveillance program. It is not     intended to diagnose MRSA     infection nor to guide or     monitor treatment for     MRSA infections.   Studies/Results: Dg Chest 2 View  10/19/2013   CLINICAL DATA:  Shortness of breath.  EXAM: CHEST  2 VIEW  COMPARISON:  PA and lateral chest x-ray of Oct 15, 2013  FINDINGS: Since the previous study the right hemithorax has become nearly opacified by confluent alveolar densities and likely by pleural fluid.  There is no shift of the mediastinum. The left lung is well-expanded and clear. The left heart border appears normal. There is tortuosity of the descending thoracic aorta.  IMPRESSION: Confluent density throughout the you right lung may reflect atelectasis or pneumonia but there is no shift of the mediastinum. There is likely pleural fluid present as well. These findings have markedly progressed since the study of May 14th.   Electronically Signed   By: David  Martinique   On: 10/19/2013 14:22   Nm Myocar Multi W/spect W/wall Motion / Ef  10/19/2013   CLINICAL DATA:  Chest pain.  Pneumonia.  EXAM: MYOCARDIAL IMAGING WITH SPECT (REST AND PHARMACOLOGIC-STRESS)  GATED LEFT VENTRICULAR WALL MOTION STUDY  LEFT VENTRICULAR EJECTION FRACTION  TECHNIQUE: Standard myocardial SPECT imaging was performed after resting intravenous injection of 10 mCi Tc-76m sestamibi. Subsequently, intravenous infusion of Lexiscan was performed under the supervision of the Cardiology staff. At peak effect of the drug, 30 mCi Tc-42m sestamibi was injected intravenously and standard myocardial SPECT imaging was performed. Quantitative gated imaging was also performed to evaluate left ventricular wall motion, and estimate left ventricular ejection fraction.  COMPARISON:  Radiographs 10/15/2013.  CT 10/30/2013.  FINDINGS: The left ventricle is dilated. There is a large predominantly fixed scar involving the apex and all distal myocardial segments. Within the adjacent septum, mild reversibility is demonstrated with a summed difference score of 6.  The QGS ejection fraction calculated at rest is 38% with an end-diastolic volume of 283TD and an end-systolic volume of 17OH. There is apical hypokinesis with mild paradoxical motion within the distal anterior wall.  IMPRESSION: 1. Left apical infarct with associated hypokinesis and mild paradoxical motion within the distal anterior wall. 2. Possible peri-infarct ischemia involving the mid septum. 3. Left  ventricular ejection fraction is 38%.   Electronically Signed   By:  Camie Patience M.D.   On: 10/19/2013 16:34   Dg Chest Port 1 View  10/20/2013   CLINICAL DATA:  80 year old male with respiratory failure. Hypoxia. Initial encounter.  EXAM: PORTABLE CHEST - 1 VIEW  COMPARISON:  10/19/2013 and earlier.  FINDINGS: Portable AP semi upright view at 1123 hrs. Continued near complete opacification of the right hemi thorax with volume loss and some rightward shift of the mediastinum. New left perihilar and basilar reticular nodular density in the left lung. Stable cardiac size and mediastinal contours. Visualized tracheal air column is within normal limits. No pneumothorax.  IMPRESSION: Confluent right lung pneumonia and new reticulonodular density in the left lung compatible with progressive bilateral pneumonia.   Electronically Signed   By: Lars Pinks M.D.   On: 10/20/2013 11:47   Medications: I have reviewed the patient's current medications. Scheduled Meds: . amiodarone  200 mg Oral QPM  . antiseptic oral rinse  15 mL Mouth Rinse BID  . aspirin EC  81 mg Oral Daily  . atorvastatin  20 mg Oral q1800  . azithromycin  500 mg Oral Daily  . citalopram  20 mg Oral QPM  . heparin  5,000 Units Subcutaneous 3 times per day  . levothyroxine  50 mcg Oral QAC breakfast  . pantoprazole  40 mg Oral Daily  . piperacillin-tazobactam (ZOSYN)  IV  3.375 g Intravenous 3 times per day  . vancomycin  1,250 mg Intravenous Q24H   Continuous Infusions:  none PRN Meds:.albuterol  Assessment/Plan: Acute hypoxemic respiratory failure 2/2 to HCAP improving:  PO2 43.0 at admission and patient initially required BiPAP.  Respiratory failure 2/2 R pneumonia in the setting of acute HF exacerbation (elevated proBNP, crackles on exam).  Concern for aspiration given results of MBS and family report he often coughs with food. CTA negative for PE.  ABG with pO2 44.5, CXR with progressive right lung airspace disease.  Increased oxygen  requirement and now back on BiPAP.  Appreciate involvement of PCCM - they believe it would be difficult to extubate this patient if he were to be intubated.  Son wants him comfortable and says patient would not want to be intubated on vent.  While in the past, patient has been agreeable to intubation if it were for a short time, he is now deferring to his son.  Will respect wishes of patient and family and keep him DNI.  If patient changes his mind at any point we will also respect that decision.  - BiPAP prn - NPO while on BiPAP - continuous pulse ox/telemetry monitoring in SDU - continue vancomycin, zosyn and azithromycin   NSTEMI:  Troponin 1.54 --> 2.15->>1.39.  No CP.  Likely demand in the setting of hypoxic respiratory failure and severe sepsis.  Cardiology consulted and recommendations appreciated.  Lipid panel: LDL 44, cholesterol 94.  10/19/13 Myoview reveals left apical infarct with associated hypokinesis and mild paradoxical motion within the distal anterior wall, possible per-infarct ishcemia involving the mid septum, EF 38%. - monitor on telemetry - cardiac cath deferred at this time given worsening renal function and etiology less likely acute ishcemia - continue ASA and statin  Severe sepsis, resolving:  Leukocytosis, tachypnea, lung source (HCAP) and lactic acidosis (4.04-->3.0).  BP stable.  WBC 12.1 >> 20. - treating HCAP as above - trend lactic acid     Acute on chronic combined HF: crackles, elevated proBNP.  Weight even since admission.  ~ net neg 1.5L.  proBNP 1,610 -->10,743 this admission.  Cardiology following and recommendations appreciated. - Lasix 40mg  IV BID - may be able to restart Coreg tomorrow if BP remains stable - strict I&Os, daily weights    AKI on CKD3, resolved:  Baseline Cr 1.3.  Admission Cr 1.21 >>1.55>>1.21.  Likely secondary to decreased po, low BP and contrast (for CTA). - monitor BMP  Atrial fibrillation:  NSR, rate controlled.  Xarelto on hold  since 10/08/13 in the setting of GI bleed. - continue amiodarone - will resume Xarelto as outpatient   Hypothyroidism:  continue synthroid  Anemia 2/2 to LGIB:  Patient admitted with GI bleed on 05/07-05/11/15 and found to have several AVMs on colonoscopy which were ablated with APC.  His hgb was 10.4 at d/c (improved from 8.0).  Hgb 12.0 >>12.8 this admission, no bleeding.   - monitor CBC and for S&S of bleeding - continue Protonix  Diet: NPO while on BiPAP VTE ppx:  Heparin [place], SCDs Code:  DNR  Dispo: Disposition is deferred at this time, awaiting improvement of current medical problems.  Anticipated discharge in approximately 2-3 day(s).   The patient does have a current PCP Leamon Arnt, MD) and does not need an [facility] hospital follow-up appointment after discharge.  The patient does not know have transportation limitations that hinder transportation to clinic appointments.  .Services Needed at time of discharge: Y = Yes, Blank = No PT:   OT:   RN:   Equipment:   Other:     LOS: 7 days   [name], DO 10/21/2013, 10:27 AM

## 2013-10-21 NOTE — Progress Notes (Signed)
Notified MD regarding patient's shortness of breath and expressing being uncomfortable after 1 mg Morphine IV. O2 sats low 80s. Orders received

## 2013-10-21 NOTE — Progress Notes (Signed)
Pt placed on BIPAP at this time due to sats that remained at 80 on NRB mask, pt comfortable and agreeable to the Bipap, will continue to monitor.

## 2013-10-21 NOTE — Progress Notes (Signed)
Patient Name: Wesley Wang Date of Encounter: 10/21/2013  Principal Problem:   HCAP (healthcare-associated pneumonia) Active Problems:   CAD (coronary artery disease)   Atrial fibrillation   GI bleed   Acute on chronic renal failure   HLD (hyperlipidemia)   Adult hypothyroidism   Angiodysplasia of colon   Acute respiratory failure with hypoxia   Severe sepsis   NSTEMI (non-ST elevated myocardial infarction)   Acute on chronic combined systolic and diastolic heart failure   Acute respiratory failure   Bronchiectasis   SUBJECTIVE  No CP or pain anywhere. Just respiratory distress now much better on BIPAP.  CURRENT MEDS . amiodarone  200 mg Oral QPM  . antiseptic oral rinse  15 mL Mouth Rinse BID  . aspirin EC  81 mg Oral Daily  . atorvastatin  20 mg Oral q1800  . azithromycin  500 mg Oral Daily  . citalopram  20 mg Oral QPM  . heparin  5,000 Units Subcutaneous 3 times per day  . levothyroxine  50 mcg Oral QAC breakfast  . pantoprazole  40 mg Oral Daily  . piperacillin-tazobactam (ZOSYN)  IV  3.375 g Intravenous 3 times per day  . vancomycin  1,250 mg Intravenous Q24H    OBJECTIVE  Filed Vitals:   10/21/13 0730 10/21/13 0920 10/21/13 0921 10/21/13 1222  BP: 129/78 125/64  122/83  Pulse: 84 99 100 92  Temp: 98 F (36.7 C)   97.4 F (36.3 C)  TempSrc: Axillary   Oral  Resp: 29 25 24  33  Height:      Weight:      SpO2: 92% 90% 90% 90%    Intake/Output Summary (Last 24 hours) at 10/21/13 1243 Last data filed at 10/21/13 0600  Gross per 24 hour  Intake     50 ml  Output    527 ml  Net   -477 ml   Filed Weights   10/19/13 0711 10/20/13 0615 10/21/13 0500  Weight: 173 lb 13.3 oz (78.85 kg) 175 lb 8 oz (79.606 kg) 173 lb 8 oz (78.7 kg)    PHYSICAL EXAM  General: Pleasant, NAD. ON bipap Neuro: Alert and oriented X 3. Moves all extremities spontaneously. Psych: Normal affect. HEENT:  Normal  Neck: Supple without bruits or JVD. Lungs:  Resp regular  and labored, Right lung with crackles and rhonchi. Heart: RRR no s3, s4, or murmurs. Abdomen: Soft, non-tender, non-distended, BS + x 4.  Extremities: No clubbing, cyanosis or edema. DP/PT/Radials 2+ and equal bilaterally.  Accessory Clinical Findings  CBC  Recent Labs  10/20/13 0505 10/21/13 0546  WBC 14.2* 20.0*  NEUTROABS 12.0* 17.6*  HGB 11.2* 12.8*  HCT 36.4* 40.7  MCV 92.6 92.7  PLT 370 831   Basic Metabolic Panel  Recent Labs  10/20/13 0505 10/21/13 0546  NA 145 145  K 4.4 4.1  CL 110 107  CO2 22 21  GLUCOSE 81 100*  BUN 14 12  CREATININE 1.31 1.21  CALCIUM 8.1* 8.1*    TELE  NSR with freq PACs  Radiology/Studies  Nm Myocar Multi W/spect W/wall Motion / Ef  10/19/2013   CLINICAL DATA:  Chest pain.  Pneumonia.   IMPRESSION: 1. Left apical infarct with associated hypokinesis and mild paradoxical motion within the distal anterior wall. 2. Possible peri-infarct ischemia involving the mid septum. 3. Left ventricular ejection fraction is 38%.   Electronically Signed   By: Camie Patience M.D.   On: 10/19/2013 16:34   Dg Chest  Port 1 View  10/20/2013   CLINICAL DATA:  80 year old male with respiratory failure. Hypoxia. Initial encounter.  EXAM: PORTABLE CHEST - 1 VIEW  COMPARISON:  10/19/2013 and earlier.  FINDINGS: Portable AP semi upright view at 1123 hrs. Continued near complete opacification of the right hemi thorax with volume loss and some rightward shift of the mediastinum. New left perihilar and basilar reticular nodular density in the left lung. Stable cardiac size and mediastinal contours. Visualized tracheal air column is within normal limits. No pneumothorax.  IMPRESSION: Confluent right lung pneumonia and new reticulonodular density in the left lung compatible with progressive bilateral pneumonia.   Electronically Signed   By: Lars Pinks M.D.   On: 10/20/2013 11:47     ASSESSMENT AND PLAN  80 yo male with mild dementia, non-obstructive CAD from cath >10  years ago, chronic systolic heart failure LVEF 35%, afib on xarelto but held due to recent GI bleed, CKD who was admitted to [facility] on 10/12/2013 with CAP and GI bleed.   1. Acute respiratory distress - he was transferred to step down with increased work of breathing and decreased O2 sats.  -- ABG 7.45/54.6/30.4 therefore patient seems to be compensating well just getting tired therefor Bipap continued  -- Patient DOES want intubation but does not want CPR  -- CXR with extensive right lung pneumonia. On azithromycin and vanc/zosyn -- Being followed by PCCM  2. NSTEMI - peak 2.15, now trending down.  -- Thought likely to be demand ischemia based on prior notes in setting of pneumonia. He has not had any chest pain.  -- Underwent MPI yesterday which revealed                         1. Left apical infarct with associated hypokinesis and mild paradoxical motion within the distal anterior wall.                         2. Possible peri-infarct ischemia involving the mid septum.                         3. Left ventricular ejection fraction is 38%. -- Continue ASA and statin. No BB due to low BPs -- MD to see for further work up  3. Afib --on and off a-fib -- Continue amiodarone -- Anticoag on hold since last admission with GIB.  -- Maintained on amio, coreg has been on hold due to low bp's   4. Acute on chronic systolic/diastolic HF  -- Chronic meds held in setting of hypotension initially and BP improving -- Currently appears euvolemic.   5. AKI  -- Renal function improving   6.  PNA  -- Abx per primary team   Pt transferred to [place] secondary to respiratory decompensation on 5/19. His CXR has clearly evolved and worsened with close to complete white out of right lung. He is on BiPAP maintaining NSR with Sats 91% (FiO2 80%). He is coherent and is a limited Code (intubate but no CPR). His MPI showed lateral scar with peri infarct ischemia and an EF of 38% (c/w his 2D EF of 35% in past). His last BNP  is >9000, I will start on Lasix 40 mg iv BID. No cath for now as this is most probably demand ischemia.  His renal fxn is stable.    [name]   10/21/2013 12:43 PM

## 2013-10-21 NOTE — Progress Notes (Signed)
Patient ID: Wesley Wang, male   DOB: [birth year], 80 y.o.   MRN: [id number] Attending physician note: Database and radiographs reviewed. Patient examined. Evaluation and management are accurate as recorded in resident physician note by Dr. [name]. Complex situation in 80 year old man with recent acute anticoagulant related GI bleed who suffered a myocardial infarction. I suspect that he did aspirate at some point and now has an extensive right lung pneumonia. He has had progressive work of breathing and falling oxygen saturation over the course of the last 72 hours and now maintaining borderline oxygenation at rest on a 80% FiO2 BiPAP mask. I personally addressed aggressive measures such as temporary intubation to support him however additional input from critical care medicine indicates that if this man was intubated, in view of his other comorbid medical conditions, he would be unlikely to be extubated. I met with the patient son together with Dr. Redmond Pulling this morning and reviewed the patient's status and the above discussion. Son was very understanding of the complexity of the situation and decision making. His main concern is that we keep his father comfortable. If there is not a good chance that he would get off a ventilator  he would not want to put his father through this. He states that these were his dad's wishes in the recent past. He will discuss this further with his family. I gave him my pager number to contact me for any updates. I reviewed with him that if we decide to switch to a more palliative mode, I would have no objection to putting  him back on nasal cannula oxygen or a Ventimask which would be more comfortable for him and minimizing oxygen monitoring. I would administer small doses of morphine for discomfort if there is progressive work of breathing. We discussed the fact that his gait would start to retain carbon dioxide and that this would act like a natural narcotic. He would  fall asleep and this would not be a painful situation.

## 2013-10-22 ENCOUNTER — Inpatient Hospital Stay (HOSPITAL_COMMUNITY): Payer: Medicare Other

## 2013-10-22 LAB — BASIC METABOLIC PANEL
BUN: 16 mg/dL (ref 6–23)
CO2: 25 mEq/L (ref 19–32)
Calcium: 8.4 mg/dL (ref 8.4–10.5)
Chloride: 107 mEq/L (ref 96–112)
Creatinine, Ser: 1.42 mg/dL — ABNORMAL HIGH (ref 0.50–1.35)
GFR calc Af Amer: 52 mL/min — ABNORMAL LOW (ref >90)
GFR calc non Af Amer: 45 mL/min — ABNORMAL LOW (ref >90)
GLUCOSE: 105 mg/dL — AB (ref 70–99)
Potassium: 3.8 mEq/L (ref 3.7–5.3)
Sodium: 149 mEq/L — ABNORMAL HIGH (ref 137–147)

## 2013-10-22 LAB — CBC WITH DIFFERENTIAL/PLATELET
BASOS PCT: 0 % (ref 0–1)
Basophils Absolute: 0 10*3/uL (ref 0.0–0.1)
Eosinophils Absolute: 0.1 10*3/uL (ref 0.0–0.7)
Eosinophils Relative: 1 % (ref 0–5)
HCT: 40 % (ref 39.0–52.0)
Hemoglobin: 11.8 g/dL — ABNORMAL LOW (ref 13.0–17.0)
Lymphocytes Relative: 4 % — ABNORMAL LOW (ref 12–46)
Lymphs Abs: 0.6 10*3/uL — ABNORMAL LOW (ref 0.7–4.0)
MCH: 27.8 pg (ref 26.0–34.0)
MCHC: 29.5 g/dL — AB (ref 30.0–36.0)
MCV: 94.1 fL (ref 78.0–100.0)
Monocytes Absolute: 2.2 10*3/uL — ABNORMAL HIGH (ref 0.1–1.0)
Monocytes Relative: 13 % — ABNORMAL HIGH (ref 3–12)
NEUTROS PCT: 82 % — AB (ref 43–77)
Neutro Abs: 14.6 10*3/uL — ABNORMAL HIGH (ref 1.7–7.7)
Platelets: 401 10*3/uL — ABNORMAL HIGH (ref 150–400)
RBC: 4.25 MIL/uL (ref 4.22–5.81)
RDW: 16.2 % — AB (ref 11.5–15.5)
WBC: 17.5 10*3/uL — ABNORMAL HIGH (ref 4.0–10.5)

## 2013-10-22 LAB — LACTIC ACID, PLASMA: Lactic Acid, Venous: 2.3 mmol/L — ABNORMAL HIGH (ref 0.5–2.2)

## 2013-10-22 MED ORDER — FUROSEMIDE 10 MG/ML IJ SOLN: 40.0000 mg | Freq: Every day | INTRAMUSCULAR | Status: DC

## 2013-10-22 MED ORDER — MORPHINE SULFATE 2 MG/ML IJ SOLN
2.0000 mg | Freq: Once | INTRAMUSCULAR | Status: DC
  Filled 2013-10-22: qty 1

## 2013-10-22 MED ORDER — LORAZEPAM 2 MG/ML IJ SOLN
1.0000 mg | Freq: Once | INTRAMUSCULAR | Status: DC
  Filled 2013-10-22: qty 1

## 2013-10-28 ENCOUNTER — Institutional Professional Consult (permissible substitution): Payer: BC Managed Care – PPO | Admitting: Internal Medicine

## 2013-11-02 NOTE — Discharge Summary (Signed)
Name: Wesley Wang MRN: [id number] DOB: [birth year] 80 y.o.  Date of Admission: 10/08/2013  5:03 PM Date of Discharge: 11-13-13 Attending Physician: Annia Belt, MD  Discharge Diagnosis: 1. Acute hypoxemic respiratory failure  2. Healthcare-associated pneumonia 3. Severe sepsis 4. Non-ST elevated myocardial infarction 5. Atrial fibrillation 6. Acute on chronic renal failure 7. Acute on chronic combined systolic and diastolic heart failure   Cause of death:  Acute hypoxemic respiratory failure  Time of death: 1520  Disposition and follow-up:   Mr.Wesley Wang was discharged from [facility] in expired condition.    Hospital Course: The patient is an 80 year old male with past medical history of CAD, atrial fibrillation, CKD3, systolic and diastolic heart failure and recent admission for GI bleed secondary to AVM and community acquired pneumonia.  He presented to the ED with complaint of increasing shortness of breath.  The patient was admitted with acute respiratory failure in the setting of pneumonia and initially required BiPAP.  He also was found to have an NSTEMI which was likely precipitated by demand ischemia in the setting of hypoxia and recent GI bleed.  Cardiology was consulted and there was no need for anticoagulation or urgent catheterization at the time.  Furthermore the patient had acute kidney injury and it was felt that catheterization should be deferred, particularly since the NSTEMI did not appear to be of cardiac etiology.  He was treated with broad spectrum IV antibiotics and supported with supplemental oxygen.  He was transitioned to nasal cannula by hospital day 2.  Given the location of his pneumonia and son's report that the patient often coughed while eating, there was some concern for aspiration.  Modified Barium Swallow revealed delayed transition to the lower esophageal sphincter and retrograde movement of barium which could result  in aspiration.  Recommendations were for regular textured food and thin liquids.  The patient's leukocytosis began to improve and he remained afebrile, however he continued to require nasal cannula.  A few days into his admission he became hypoxic despite nasal cannula during myoview.  CXR showed worsening of his right lung airspace disease.  He was put back on BiPAP, transferred to the step-down unit and broad spectrum antibiotics were continued.  We discussed intubation with the patient and he initially felt it would be okay if it could get him through an acute illness.  However, Critical Care was consulted and in their view he would not be able to wean off of the ventilator and there was concern for aspiration. The patient and his family were presented with options, the patient deferred to his son's decision and decided to remain DNR/DNI and focus on comfort measures.  The patient was given small doses of morphine for respiratory comfort.  On 11/13/13, the patient continued to exhibit distress despite support with 100% BIPAP.  He asked to have water and was taken off BiPAP and put on a non-rebreather so he could drink.  At one point he began to refuse non-rebreather.  Resident, Dr. Algis Liming was called to his bedside.  The patient was alert and expressed his desire to be comfortable.  Dr. Algis Liming spoke with the patient's son via telephone and informed him that the patient was actively dying.  Unfortunately, he was unable to come to the hospital at the time.  The patient passed at 1520 and death was pronounced by Dr. Algis Liming.   Signed: [name], DO  November 13, 2013, 3:42 PM Attending physician note: Report accurate above by  resident physician [name] with the following comments: Non-ST elevation MI  is a primary cardiac event but did not require any further intervention. I personally met with the patient's son along with the resident physician Dr. Algis Liming on the day before his demise to review status and  disposition. I also discussed the patient's situation with his daughter by phone. The family was in agreement that prolonged life support measures were not indicated and that these were the wishes of the patient prior to this illness. The patient expired quietly on 10/27/13.  Murriel Hopper, MD, [place]  Hematology-Oncology/Internal Medicine

## 2013-11-02 NOTE — Progress Notes (Signed)
Patient ID: Wesley Wang, male   DOB: 1931-11-19, 80 y.o.   MRN: [id number] Attending physician note: Patient examined together with the health care team. Status and management plan accurate as recorded by resident physician Dr. [name]. The patient remains in moderate respiratory distress but is currently oxygenating at an acceptable level on BiPAP. Chest radiograph stable but extensive changes in the right lung. I spoke with the patient's daughter yesterday by phone. She will arrive this morning from [place]. All family members are in agreement in changing the emphasis of his care to more comfort measures. We are administering small doses of morphine to suppress respirations. We will minimize electronic monitoring. Since our team has an excellent working relationship with the family I do not feel that additional intervention is necessary with respect to a palliative care consult. We appreciate the input from other consultants. Murriel Hopper, M.D., [place]

## 2013-11-02 NOTE — Progress Notes (Signed)
Pt demanding water. Have reinforced to patient the risk of aspiration with bipap use. Pt states he will get up and walk out. Discussed his dependence on bipap for now. Pt requesting I call the doctor regarding this. Have spoken with teaching service already regarding this request. Paged teaching service again per patient's request. Will continue to monitor and await call back from teaching service. Roselyn Reef Maycen Degregory,RN

## 2013-11-02 NOTE — Progress Notes (Signed)
Was called to patent bedside that he was refusing NRB and was uncomfortable. Pt was tachynpneic sats in 70s on 100% NRB. Pt was fully competent and wanted to be comfortable and removed mask once his son could make it. Son was contacted multiple times by the nurse and I personally spoke to son that it was patient's last wish to have him here and that he was actively passing.   Pt then developed agonal breathing and passed at 1520. This provider was present and pronounced death. Pt had no spontaneous breathing, no pulse, and no audible heart sounds.   Clinton Gallant, MD Pgr: [phone]

## 2013-11-02 NOTE — Progress Notes (Signed)
Called by secretary to pt's room. Pt called out & said he couldn't breathe. Upon entering room, pt had taken off 100% NRB & was on room air, gray & agonal breathing. Placed pt back on Bipap. MD called  By RN

## 2013-11-02 NOTE — Progress Notes (Signed)
Patient Name: KESHAV WINEGAR Date of Encounter: 2013/11/20  Principal Problem:   HCAP (healthcare-associated pneumonia) Active Problems:   CAD (coronary artery disease)   Atrial fibrillation   GI bleed   Acute on chronic renal failure   HLD (hyperlipidemia)   Adult hypothyroidism   Angiodysplasia of colon   Acute respiratory failure with hypoxia   Severe sepsis   NSTEMI (non-ST elevated myocardial infarction)   Acute on chronic combined systolic and diastolic heart failure   Acute respiratory failure   Bronchiectasis   SUBJECTIVE  No CP or pain anywhere. Just respiratory distress now much better on BIPAP.  CURRENT MEDS . amiodarone  200 mg Oral QPM  . antiseptic oral rinse  15 mL Mouth Rinse BID  . aspirin EC  81 mg Oral Daily  . atorvastatin  20 mg Oral q1800  . citalopram  20 mg Oral QPM  . furosemide  40 mg Intravenous BID  . heparin  5,000 Units Subcutaneous 3 times per day  . levothyroxine  50 mcg Oral QAC breakfast  . pantoprazole  40 mg Oral Daily  . piperacillin-tazobactam (ZOSYN)  IV  3.375 g Intravenous 3 times per day  . vancomycin  1,250 mg Intravenous Q24H    OBJECTIVE  Filed Vitals:   2013-11-20 0002 November 20, 2013 0351 11/20/2013 0500 20-Nov-2013 0730  BP:  103/56  112/62  Pulse: 69 111  94  Temp:  97 F (36.1 C)  98.7 F (37.1 C)  TempSrc:  Axillary  Axillary  Resp: 21 20  25   Height:      Weight:   170 lb 13.7 oz (77.5 kg)   SpO2: 92% 90%  92%    Intake/Output Summary (Last 24 hours) at 20-Nov-2013 0838 Last data filed at 11-20-2013 0804  Gross per 24 hour  Intake    430 ml  Output   2680 ml  Net  -2250 ml   Filed Weights   10/20/13 0615 10/21/13 0500 11/20/2013 0500  Weight: 175 lb 8 oz (79.606 kg) 173 lb 8 oz (78.7 kg) 170 lb 13.7 oz (77.5 kg)    PHYSICAL EXAM  General: Pleasant, NAD. ON bipap Neuro: Alert and oriented X 3. Moves all extremities spontaneously. Psych: Normal affect. HEENT:  Normal  Neck: Supple without bruits or JVD. Lungs:   Resp regular and labored, Right lung with crackles and rhonchi. Heart: RRR no s3, s4, or murmurs. Abdomen: Soft, non-tender, non-distended, BS + x 4.  Extremities: No clubbing, cyanosis or edema. DP/PT/Radials 2+ and equal bilaterally.  Accessory Clinical Findings  CBC  Recent Labs  10/21/13 0546 2013-11-20 0357  WBC 20.0* 17.5*  NEUTROABS 17.6* 14.6*  HGB 12.8* 11.8*  HCT 40.7 40.0  MCV 92.7 94.1  PLT 343 161*   Basic Metabolic Panel  Recent Labs  10/21/13 0546 11/20/2013 0357  NA 145 149*  K 4.1 3.8  CL 107 107  CO2 21 25  GLUCOSE 100* 105*  BUN 12 16  CREATININE 1.21 1.42*  CALCIUM 8.1* 8.4    TELE  NSR with freq PACs  Radiology/Studies  Nm Myocar Multi W/spect W/wall Motion / Ef  10/19/2013   CLINICAL DATA:  Chest pain.  Pneumonia.   IMPRESSION: 1. Left apical infarct with associated hypokinesis and mild paradoxical motion within the distal anterior wall. 2. Possible peri-infarct ischemia involving the mid septum. 3. Left ventricular ejection fraction is 38%.   Electronically Signed   By: Camie Patience M.D.   On: 10/19/2013 16:34  Dg Chest Port 1 View  10/20/2013   CLINICAL DATA:  80 year old male with respiratory failure. Hypoxia. Initial encounter.  EXAM: PORTABLE CHEST - 1 VIEW  COMPARISON:  10/19/2013 and earlier.  FINDINGS: Portable AP semi upright view at 1123 hrs. Continued near complete opacification of the right hemi thorax with volume loss and some rightward shift of the mediastinum. New left perihilar and basilar reticular nodular density in the left lung. Stable cardiac size and mediastinal contours. Visualized tracheal air column is within normal limits. No pneumothorax.  IMPRESSION: Confluent right lung pneumonia and new reticulonodular density in the left lung compatible with progressive bilateral pneumonia.   Electronically Signed   By: Lars Pinks M.D.   On: 10/20/2013 11:47     ASSESSMENT AND PLAN  80 yo male with mild dementia, non-obstructive CAD  from cath >10 years ago, chronic systolic heart failure LVEF 35%, afib on xarelto but held due to recent GI bleed, CKD who was admitted to [facility] [place] on 10/20/2013 with CAP and GI bleed.   1. Acute respiratory distress - he was transferred to step down with increased work of breathing and decreased O2 sats.  -- ABG 7.45/54.6/30.4 therefore patient seems to be compensating well just getting tired therefor Bipap continued  -- Patient DOES want intubation but does not want CPR  -- CXR with extensive right lung pneumonia. On azithromycin and vanc/zosyn -- Being followed by PCCM  2. Acute on chronic systolic/diastolic CHF - lasix started yesterday with excellent response fluid balance - 2.2 liters  3. NSTEMI - peak 2.15, now trending down.  -- Thought likely to be demand ischemia based on prior notes in setting of pneumonia. He has not had any chest pain.  -- Underwent MPI yesterday which revealed                         1. Left apical infarct with associated hypokinesis and mild paradoxical motion within the distal anterior wall.                         2. Possible peri-infarct ischemia involving the mid septum.                         3. Left ventricular ejection fraction is 38%. -- Continue ASA and statin. No BB due to low BPs -- MD to see for further work up  4. Afib --on and off a-fib -- Continue amiodarone -- Anticoag on hold since last admission with GIB.  -- Maintained on amio, coreg has been on hold due to low bp's   5. AKI  -- Renal function the same 1.3-->1.2--> 1.4   6.  PNA  -- Abx per primary team   Pt transferred to [place] secondary to respiratory decompensation on 5/19. His CXR has clearly evolved and worsened with close to complete white out of right lung. He is on BiPAP maintaining NSR with Sats 91% (FiO2 80%). He is coherent and is a limited Code (intubate but no CPR). His MPI showed lateral scar with peri infarct ischemia and an EF of 38% (c/w his 2D EF of 35% in past). His last BNP is  >9000, started on lasix and responded very well with - 2.2 Liters in 24 hours. He is still overloaded and dependent on BiPAP, we will continue the  Diuresis to 20 mg iv BID. No cath as this is most probably demand ischemia.  WBC is elevated, on ATB for aspiration pneumonia.    [name]  10/10/2013 8:38 AM

## 2013-11-02 NOTE — Progress Notes (Signed)
Pt has pulled Bipap off & is refusing to wear it & wants to take all oxygen off. Pt is aware that if he takes his oxygen off he will respiratory arrest. Pt states that he wants to die. RN paged MD & called son.

## 2013-11-02 NOTE — Progress Notes (Signed)
Patient has transferred back to [facility] and report provided to Ky Barban, [place] . Resident of [place] with original evaluation by PT for SNF.  Physical Therapy has not been able to work with patient since initial PT Eval due to medical issues.   CSW services will assist with SNF placement. Lorie Phenix. [place], [place]

## 2013-11-02 NOTE — Progress Notes (Signed)
Pt on 100% NRB mask. Mask came off pt and pt rang out low O2 sats. When resp. Therapist arrived in room pt was in agonal respirations, dusky color. Bipap placed on pt and pt's color returned and is able to follow commands (squeeze hands). Pt non verbal at this time. Dr. Algis Liming paged. She said she would come to the unit shortly to assess pt. Pt's son also called, VM left. Will continue to monitor. - Soyla Dryer,   **Dr. Algis Liming currently at bedside.

## 2013-11-02 NOTE — Progress Notes (Signed)
Patient time of death 46. Dr. Algis Liming and myself at the bedside to pronounce. Family notified. Pt's daughter in law arrived and stated a funeral home had not been decided on. Pt's daughter in law took patient's belongings including a duffel bag of personal items, dentures, and a wedding band.  Roselyn Reef Jendayi Berling,RN

## 2013-11-02 NOTE — Progress Notes (Signed)
Subjective: No acute events overnight.  The patient continues to be uncomfortable on BiPAP.  Is asking to drink water.  He is responding appropriately, shaking hands with the team.  Objective: Vital signs in last 24 hours: Filed Vitals:   10/02/2013 0000 10/26/2013 0002 10/29/2013 0351 10/09/2013 0500  BP:   103/56   Pulse:  69 111   Temp: 98 F (36.7 C)  97 F (36.1 C)   TempSrc: Oral  Axillary   Resp:  21 20   Height:      Weight:    77.5 kg (170 lb 13.7 oz)  SpO2:  92% 90%    Weight change: -1.2 kg (-2 lb 10.3 oz)  Intake/Output Summary (Last 24 hours) at 10/15/2013 4034 Last data filed at 10/09/2013 0113  Gross per 24 hour  Intake    180 ml  Output   1880 ml  Net  -1700 ml   General: resting in bed in mild distress HEENT:  BiPAP mask in place Cardiac: RRR, no rubs, murmurs or gallops Pulm: coarse sounds on right but difficult to hear over BiPAP machine; + accessory muscle use, able to speak; SpO2 90s on 100% via BiPAP Abd: soft, nontender, BS present, minimal distention Ext: warm and well perfused, no pedal edema Neuro: alert and oriented X3, responding appropriately, able to move all four extremities voluntarily  Lab Results: Basic Metabolic Panel:  Recent Labs Lab 10/21/13 0546 10/20/2013 0357  NA 145 149*  K 4.1 3.8  CL 107 107  CO2 21 25  GLUCOSE 100* 105*  BUN 12 16  CREATININE 1.21 1.42*  CALCIUM 8.1* 8.4   CBC:  Recent Labs Lab 10/21/13 0546 10/05/2013 0357  WBC 20.0* 17.5*  NEUTROABS 17.6* 14.6*  HGB 12.8* 11.8*  HCT 40.7 40.0  MCV 92.7 94.1  PLT 343 401*   Cardiac Enzymes:  Recent Labs Lab 10/15/13 1042  TROPONINI 1.39*   Micro Results: Recent Results (from the past 240 hour(s))  CULTURE, BLOOD (ROUTINE X 2)     Status: None   Collection Time    10/17/2013  6:00 PM      Result Value Ref Range Status   Specimen Description BLOOD LEFT FOREARM   Final   Special Requests BOTTLES DRAWN AEROBIC AND ANAEROBIC 6CC   Final   Culture  Setup Time      Final   Value: 10/20/2013 22:59     Performed at [facility]   Culture     Final   Value: NO GROWTH 5 DAYS     Performed at [facility]   Report Status 10/20/2013 FINAL   Final  CULTURE, BLOOD (ROUTINE X 2)     Status: None   Collection Time    10/09/2013  6:10 PM      Result Value Ref Range Status   Specimen Description BLOOD ARM RIGHT   Final   Special Requests BOTTLES DRAWN AEROBIC AND ANAEROBIC 5CC   Final   Culture  Setup Time     Final   Value: 10/19/2013 22:59     Performed at [facility]     Final   Value: NO GROWTH 5 DAYS     Performed at [facility]   Report Status 10/20/2013 FINAL   Final  MRSA PCR SCREENING     Status: None   Collection Time    10/15/13  4:40 PM      Result Value Ref Range Status  MRSA by PCR NEGATIVE  NEGATIVE Final   Comment:            The GeneXpert MRSA Assay (FDA     approved for NASAL specimens     only), is one component of a     comprehensive MRSA colonization     surveillance program. It is not     intended to diagnose MRSA     infection nor to guide or     monitor treatment for     MRSA infections.   Studies/Results: Dg Chest Port 1 View  10/20/2013   CLINICAL DATA:  80 year old male with respiratory failure. Hypoxia. Initial encounter.  EXAM: PORTABLE CHEST - 1 VIEW  COMPARISON:  10/19/2013 and earlier.  FINDINGS: Portable AP semi upright view at 1123 hrs. Continued near complete opacification of the right hemi thorax with volume loss and some rightward shift of the mediastinum. New left perihilar and basilar reticular nodular density in the left lung. Stable cardiac size and mediastinal contours. Visualized tracheal air column is within normal limits. No pneumothorax.  IMPRESSION: Confluent right lung pneumonia and new reticulonodular density in the left lung compatible with progressive bilateral pneumonia.   Electronically Signed   By: Lars Pinks M.D.   On: 10/20/2013 11:47   Medications: I  have reviewed the patient's current medications. Scheduled Meds: . amiodarone  200 mg Oral QPM  . antiseptic oral rinse  15 mL Mouth Rinse BID  . aspirin EC  81 mg Oral Daily  . atorvastatin  20 mg Oral q1800  . citalopram  20 mg Oral QPM  . furosemide  40 mg Intravenous BID  . heparin  5,000 Units Subcutaneous 3 times per day  . levothyroxine  50 mcg Oral QAC breakfast  . pantoprazole  40 mg Oral Daily  . piperacillin-tazobactam (ZOSYN)  IV  3.375 g Intravenous 3 times per day  . vancomycin  1,250 mg Intravenous Q24H   Continuous Infusions:  none PRN Meds:.albuterol, morphine injection  Assessment/Plan: Acute hypoxemic respiratory failure 2/2 to HCAP improving:  Initially required BiPAP but was able to transition to nasal cannula.  A few days into his admission became hypoxic and was put back on BiPAP.  CXR showed worsening of his right lung airspace disease.  Broad spectrum antibiotics started at admission were continued and he was supported with BiPAP.  We discussed intubation with the patient and he initially felt it would be okay if it could get him through an acute illness.  However, PCCM was consulted and in their view he would not be able to wean off of the ventilator and there was concern for aspiration.  The patient and his family were presented with options, the patient deferred to his son's decision and decided to remain DNR/DNI.  This morning, the patient continued to exhibit distress despite 100% BIPAP.  He asked to have water and was taken off BiPAP and put on NRB so he could drink.  At one point he began to refuse NRB.  Resident, Dr. Algis Liming was called to bedside.  He was AAO and expressed his desire to be comfortable.  The patient passed at 1520 and death was pronounced by Dr. Algis Liming.  Dispo: Disposition is deferred at this time, awaiting improvement of current medical problems.  Anticipated discharge in approximately 2-3 day(s).   The patient does have a current PCP Leamon Arnt, MD) and does not need an [facility] hospital follow-up appointment after discharge.  The patient does not know  have transportation limitations that hinder transportation to clinic appointments.  .Services Needed at time of discharge: Y = Yes, Blank = No PT:   OT:   RN:   Equipment:   Other:     LOS: 8 days   [name], DO 10/03/2013, 7:26 AM

## 2013-11-02 NOTE — Progress Notes (Signed)
Per Dr. Algis Liming pt is to be allowed to drink water while using bipap or NRB mask.  Will continue to monitor. - Soyla Dryer, RN

## 2013-11-02 NOTE — Progress Notes (Signed)
Received handoff from pt's previous unit CSW. This CSW now following case. Pt potentially meets requirements for LTACH; RNCM working on this. CSW will provide assistance with discharge to SNF if this is medically appropriate plan.   Ky Barban, MSW, [facility] Clinical Social Worker [phone]

## 2013-11-02 DEATH — deceased

## 2014-05-13 ENCOUNTER — Encounter (HOSPITAL_COMMUNITY): Payer: Self-pay | Admitting: Cardiology

## 2015-05-11 IMAGING — CT CT ANGIO CHEST
2 of 8 series · 19 of 46 positions shown · IV contrast (Omni 300)
Comparison: None.

CLINICAL DATA: Shortness of breath

EXAM:
CT ANGIOGRAPHY CHEST WITH CONTRAST
TECHNIQUE: Multidetector CT imaging of the chest was performed using the
standard protocol during bolus administration of intravenous
contrast. Multiplanar CT image reconstructions and MIPs were
obtained to evaluate the vascular anatomy.
CONTRAST:  70mL OMNIPAQUE IOHEXOL 350 MG/ML SOLN

[Series 5: thins · axial · 0.67mm/px · z∈[+1238,+1478]mm · 16 of 264 slices shown]
[im 12/264  lung]
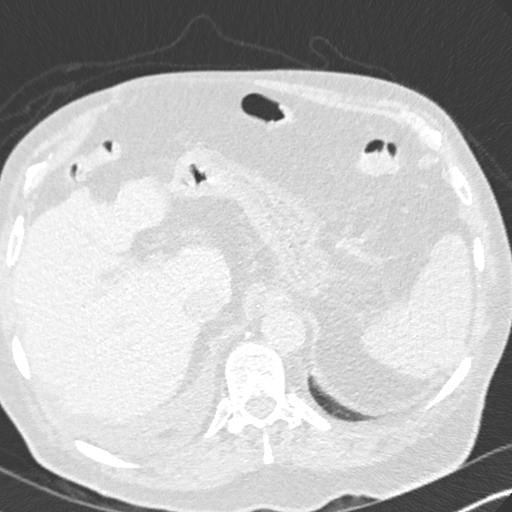
[im 24/264  soft-tissue]
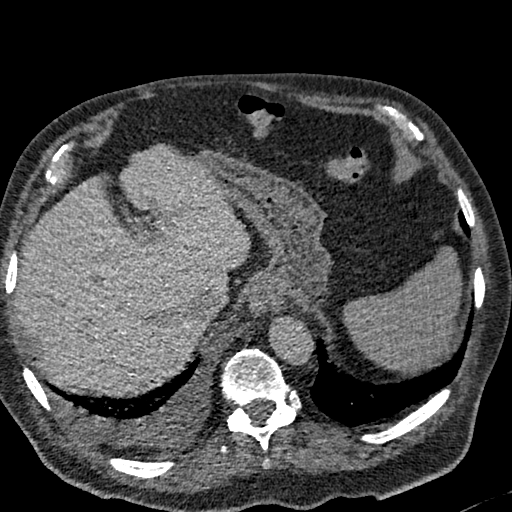
[im 48/264  lung]
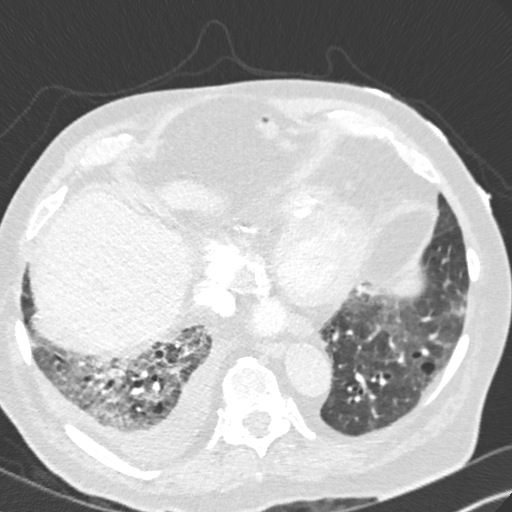
[im 60/264  soft-tissue]
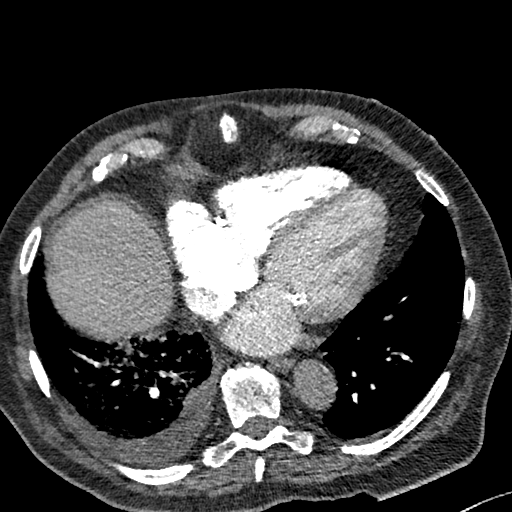
[im 72/264  lung]
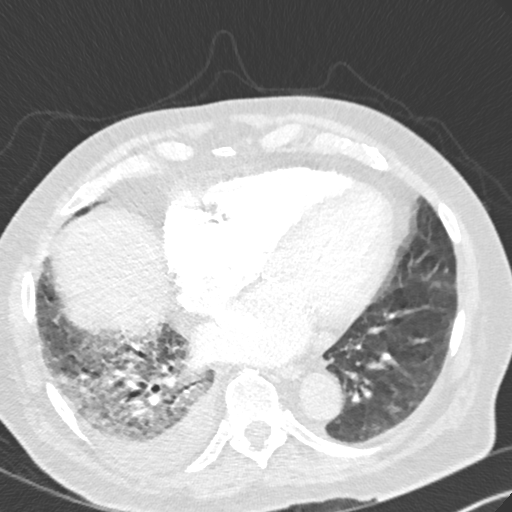
[im 96/264  soft-tissue]
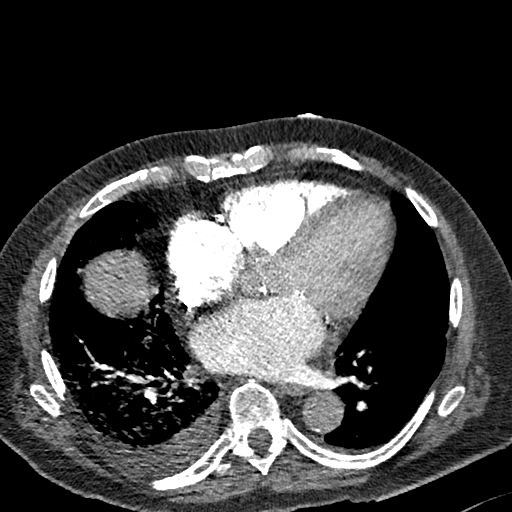
[im 108/264  lung]
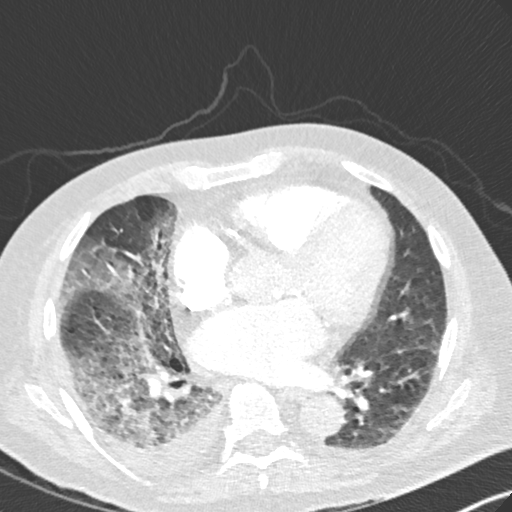
[im 120/264  soft-tissue]
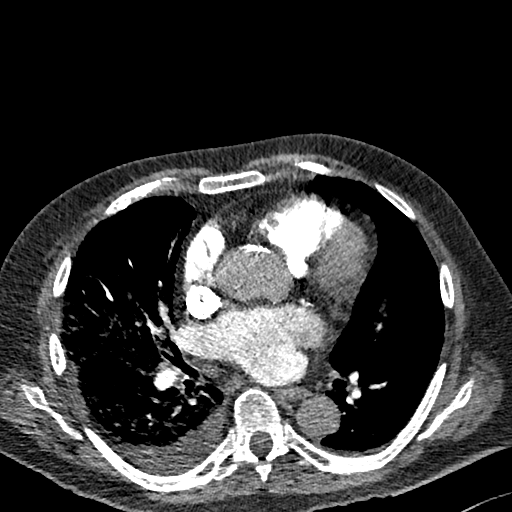
[im 144/264  lung]
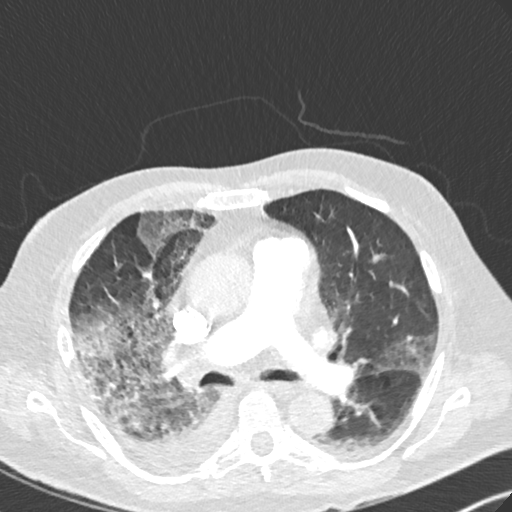
[im 156/264  soft-tissue]
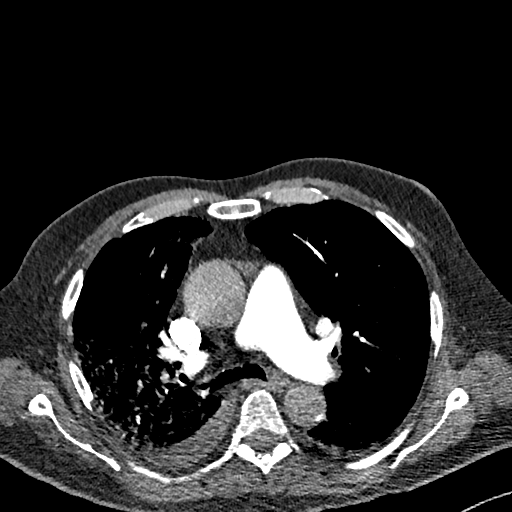
[im 168/264  lung]
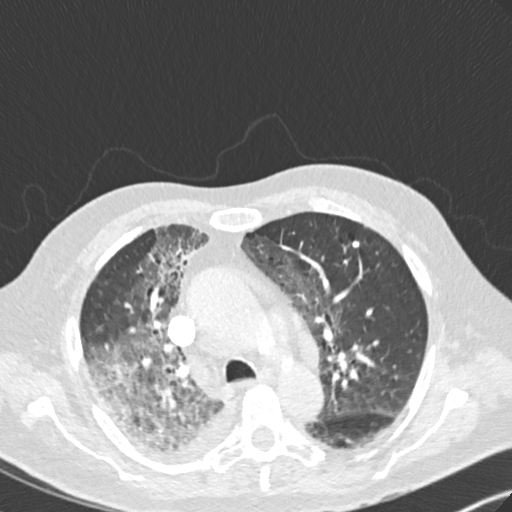
[im 192/264  soft-tissue]
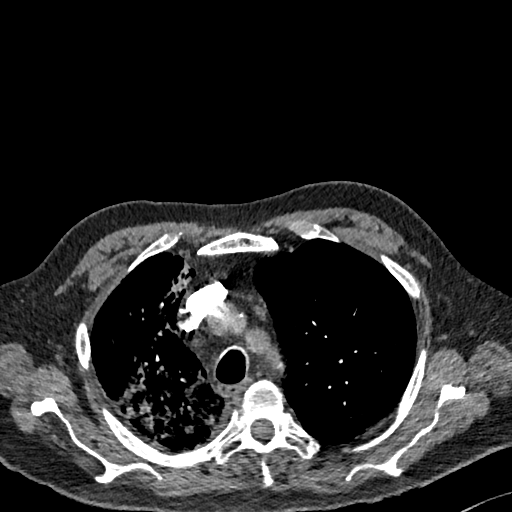
[im 204/264  lung]
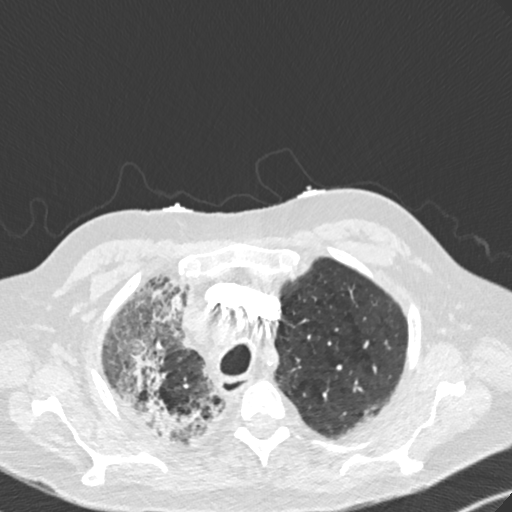
[im 216/264  soft-tissue]
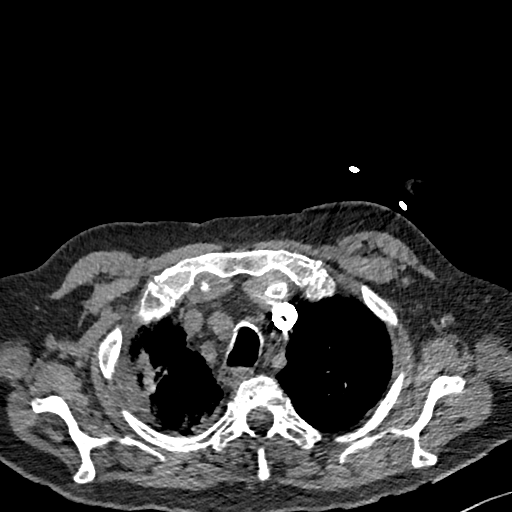
[im 240/264  lung]
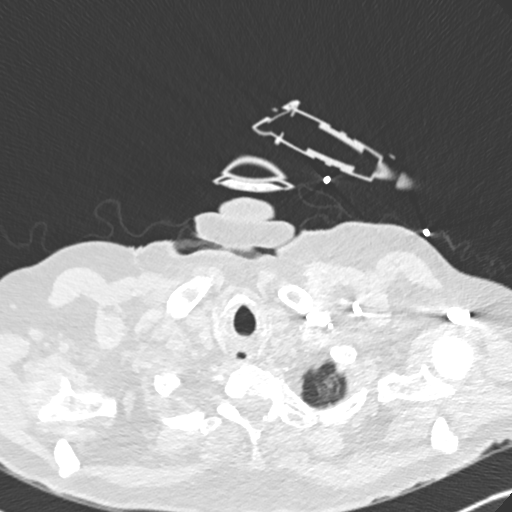
[im 252/264  soft-tissue]
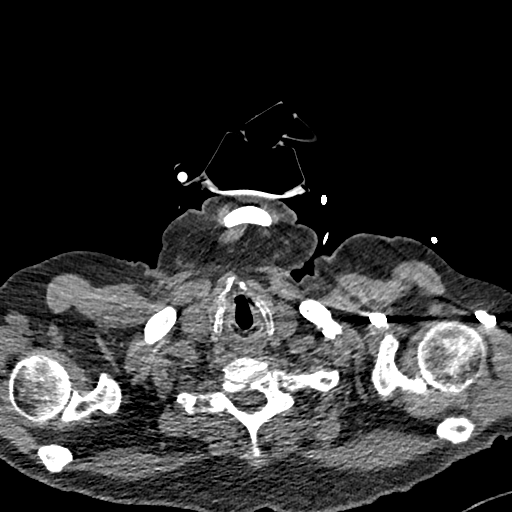

[Series 7: coronal mpr · coronal · 0.59mm/px · 3 of 111 slices shown]
[im 28/111  soft-tissue]
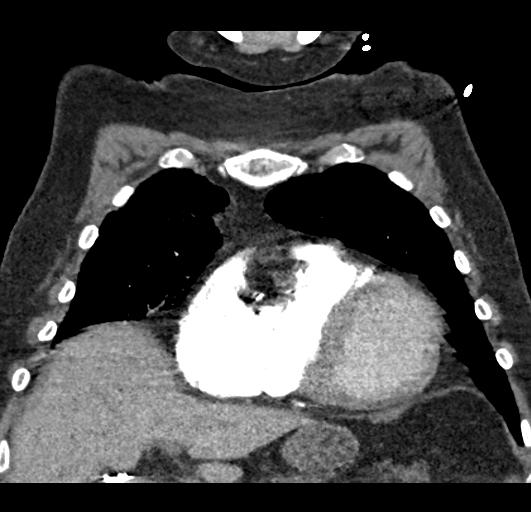
[im 56/111  soft-tissue]
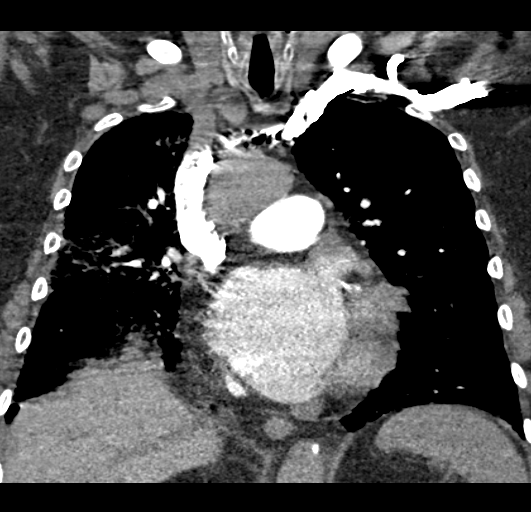
[im 83/111  soft-tissue]
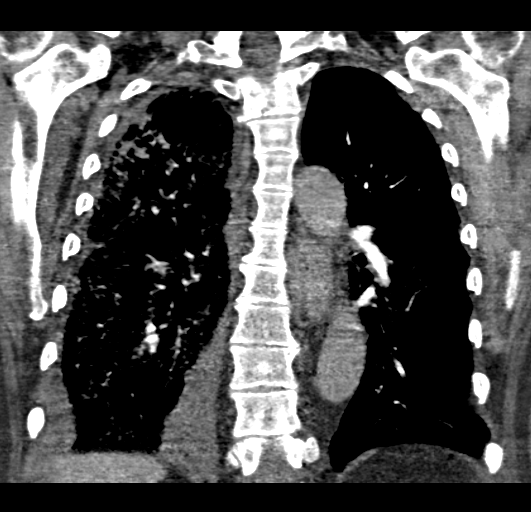

[19 of 46 positions shown; findings below may reference images not displayed]

FINDINGS: The lungs are well aerated bilaterally. Diffuse emphysematous
changes are seen. There are changes predominately within the right
lower lobe as well as within the right upper lobe to a lesser degree
consistent with bronchiectasis. Some of these changes are chronic in
nature is seen on prior plain film examination. There is likely a
degree of acute on chronic infiltrate. A small right-sided pleural
effusion is noted. Some mild changes are noted on the left likely of
a chronic nature as well.

Calcification of thoracic aorta is noted. The opacification is poor.
Pulmonary artery demonstrates a normal branching pattern. No
definitive filling defects to suggest pulmonary emboli are
identified. No significant hilar or mediastinal adenopathy is seen.
Heavy coronary calcifications are noted.

Scanning into the upper abdomen reveals no acute abnormality. The
bony structures are within normal limits.

Review of the MIP images confirms the above findings.
IMPRESSION: Acute on chronic infiltrate in the right lung with associated
right-sided effusion.

Mild similar chronic changes are noted scattered throughout the left
lung.

Changes of bronchiectasis are noted bilaterally.

No evidence of pulmonary emboli is seen.

## 2015-05-12 IMAGING — CR DG CHEST 2V
2 series · 2 of 2 positions shown · non-contrast
Comparison: 10/14/2013.

CLINICAL DATA: Shortness of breath, respiratory failure

EXAM:
CHEST  2 VIEW

[w chest lat]
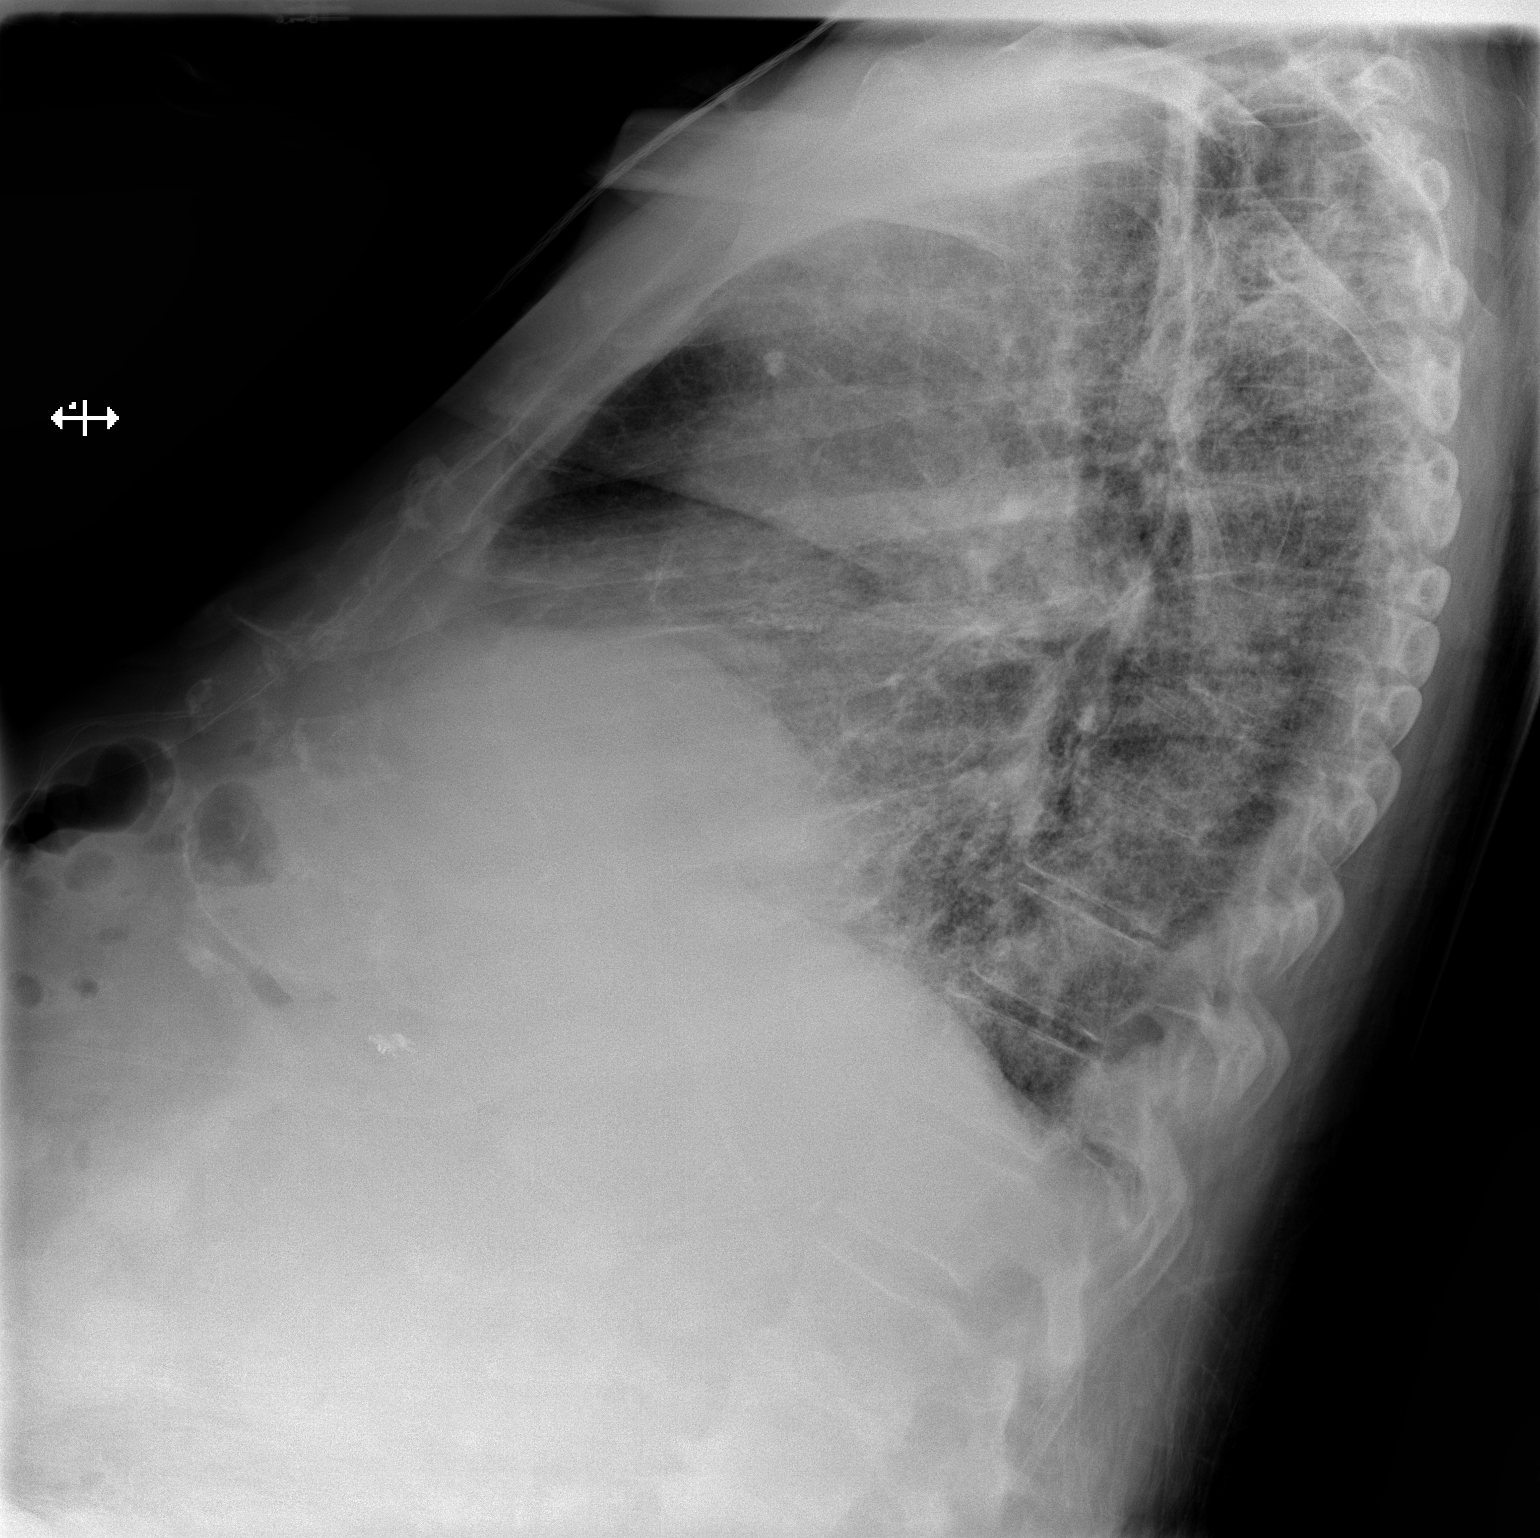

[x chest ap]
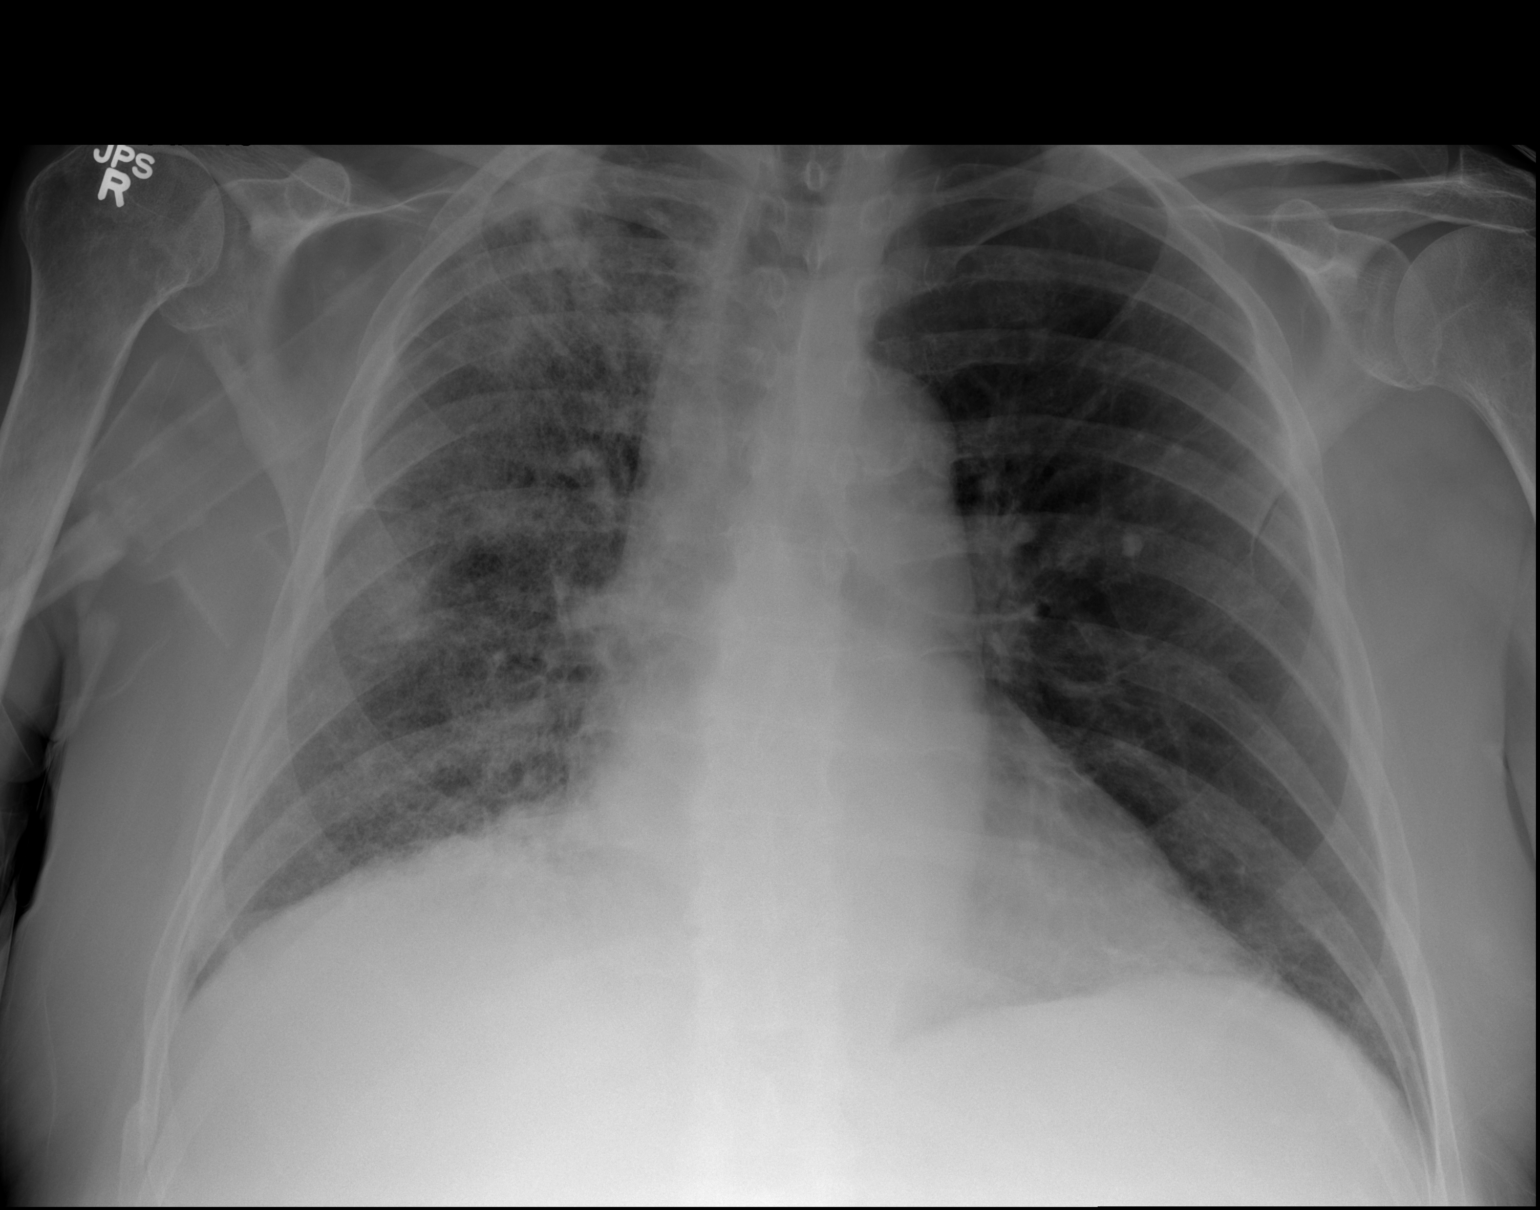

[2 of 2 positions shown; findings below may reference images not displayed]

FINDINGS: There is patchy infiltrate/ pneumonia in right lung. Small right
pleural effusion. Left lung is clear. Minimal degenerative changes
thoracic spine. No pulmonary edema.
IMPRESSION: Patchy infiltrate/ pneumonia in right lung. Small right pleural
effusion. No pulmonary edema.
